# Patient Record
Sex: Female | Born: 1940 | Race: White | Hispanic: No | Marital: Married | Smoking: Never smoker
Health system: Southern US, Community
[De-identification: ages and names within clinical notes are randomized; demographics above are authoritative.]

## PROBLEM LIST (undated history)

## (undated) DIAGNOSIS — I10 Essential (primary) hypertension: Secondary | ICD-10-CM

## (undated) DIAGNOSIS — C4491 Basal cell carcinoma of skin, unspecified: Secondary | ICD-10-CM

## (undated) DIAGNOSIS — M1990 Unspecified osteoarthritis, unspecified site: Secondary | ICD-10-CM

## (undated) HISTORY — PX: BACK SURGERY: SHX140

## (undated) HISTORY — PX: OTHER SURGICAL HISTORY: SHX169

## (undated) HISTORY — PX: TUBAL LIGATION: SHX77

## (undated) HISTORY — DX: Basal cell carcinoma of skin, unspecified: C44.91

## (undated) HISTORY — PX: HAND SURGERY: SHX662

---

## 1999-07-01 ENCOUNTER — Ambulatory Visit (HOSPITAL_COMMUNITY): Admission: RE | Admit: 1999-07-01 | Discharge: 1999-07-01 | Payer: Self-pay | Admitting: Obstetrics and Gynecology

## 1999-10-30 ENCOUNTER — Encounter: Admission: RE | Admit: 1999-10-30 | Discharge: 1999-10-30 | Payer: Self-pay | Admitting: Obstetrics and Gynecology

## 1999-10-30 ENCOUNTER — Encounter: Payer: Self-pay | Admitting: Obstetrics and Gynecology

## 2000-07-28 ENCOUNTER — Other Ambulatory Visit: Admission: RE | Admit: 2000-07-28 | Discharge: 2000-07-28 | Payer: Self-pay | Admitting: Obstetrics and Gynecology

## 2000-11-01 ENCOUNTER — Encounter: Payer: Self-pay | Admitting: Obstetrics and Gynecology

## 2000-11-01 ENCOUNTER — Encounter: Admission: RE | Admit: 2000-11-01 | Discharge: 2000-11-01 | Payer: Self-pay | Admitting: Obstetrics and Gynecology

## 2001-08-10 ENCOUNTER — Other Ambulatory Visit: Admission: RE | Admit: 2001-08-10 | Discharge: 2001-08-10 | Payer: Self-pay | Admitting: Internal Medicine

## 2001-11-07 ENCOUNTER — Encounter: Payer: Self-pay | Admitting: Obstetrics and Gynecology

## 2001-11-07 ENCOUNTER — Encounter: Admission: RE | Admit: 2001-11-07 | Discharge: 2001-11-07 | Payer: Self-pay | Admitting: Obstetrics and Gynecology

## 2002-11-10 ENCOUNTER — Encounter: Admission: RE | Admit: 2002-11-10 | Discharge: 2002-11-10 | Payer: Self-pay | Admitting: Obstetrics and Gynecology

## 2002-11-10 ENCOUNTER — Encounter: Payer: Self-pay | Admitting: Obstetrics and Gynecology

## 2003-11-12 ENCOUNTER — Encounter: Admission: RE | Admit: 2003-11-12 | Discharge: 2003-11-12 | Payer: Self-pay | Admitting: Obstetrics and Gynecology

## 2004-11-17 ENCOUNTER — Encounter: Admission: RE | Admit: 2004-11-17 | Discharge: 2004-11-17 | Payer: Self-pay | Admitting: Obstetrics and Gynecology

## 2005-11-18 ENCOUNTER — Encounter: Admission: RE | Admit: 2005-11-18 | Discharge: 2005-11-18 | Payer: Self-pay | Admitting: Obstetrics and Gynecology

## 2006-11-19 ENCOUNTER — Encounter: Admission: RE | Admit: 2006-11-19 | Discharge: 2006-11-19 | Payer: Self-pay | Admitting: Obstetrics and Gynecology

## 2007-11-22 ENCOUNTER — Encounter: Admission: RE | Admit: 2007-11-22 | Discharge: 2007-11-22 | Payer: Self-pay | Admitting: Obstetrics and Gynecology

## 2008-11-23 ENCOUNTER — Encounter: Admission: RE | Admit: 2008-11-23 | Discharge: 2008-11-23 | Payer: Self-pay | Admitting: Obstetrics and Gynecology

## 2009-01-08 ENCOUNTER — Encounter: Admission: RE | Admit: 2009-01-08 | Discharge: 2009-01-08 | Payer: Self-pay | Admitting: Family Medicine

## 2009-11-25 ENCOUNTER — Encounter: Admission: RE | Admit: 2009-11-25 | Discharge: 2009-11-25 | Payer: Self-pay | Admitting: Obstetrics and Gynecology

## 2010-11-05 ENCOUNTER — Other Ambulatory Visit: Payer: Self-pay | Admitting: Obstetrics and Gynecology

## 2010-11-05 DIAGNOSIS — Z1239 Encounter for other screening for malignant neoplasm of breast: Secondary | ICD-10-CM

## 2010-11-26 ENCOUNTER — Ambulatory Visit
Admission: RE | Admit: 2010-11-26 | Discharge: 2010-11-26 | Disposition: A | Discharge status: Home / Self Care | Payer: BC Managed Care – PPO | Source: Ambulatory Visit | Attending: Obstetrics and Gynecology | Admitting: Obstetrics and Gynecology

## 2010-11-26 DIAGNOSIS — Z1239 Encounter for other screening for malignant neoplasm of breast: Secondary | ICD-10-CM

## 2011-10-28 ENCOUNTER — Other Ambulatory Visit: Payer: Self-pay | Admitting: Obstetrics and Gynecology

## 2011-10-28 DIAGNOSIS — Z1231 Encounter for screening mammogram for malignant neoplasm of breast: Secondary | ICD-10-CM

## 2011-12-02 ENCOUNTER — Ambulatory Visit
Admission: RE | Admit: 2011-12-02 | Discharge: 2011-12-02 | Disposition: A | Discharge status: Home / Self Care | Payer: BC Managed Care – PPO | Source: Ambulatory Visit | Attending: Obstetrics and Gynecology | Admitting: Obstetrics and Gynecology

## 2011-12-02 DIAGNOSIS — Z1231 Encounter for screening mammogram for malignant neoplasm of breast: Secondary | ICD-10-CM

## 2012-11-01 ENCOUNTER — Other Ambulatory Visit: Payer: Self-pay | Admitting: Obstetrics and Gynecology

## 2012-11-01 DIAGNOSIS — Z1231 Encounter for screening mammogram for malignant neoplasm of breast: Secondary | ICD-10-CM

## 2012-12-02 ENCOUNTER — Ambulatory Visit
Admission: RE | Admit: 2012-12-02 | Discharge: 2012-12-02 | Disposition: A | Discharge status: Home / Self Care | Payer: Medicare Other | Source: Ambulatory Visit | Attending: Obstetrics and Gynecology | Admitting: Obstetrics and Gynecology

## 2012-12-02 DIAGNOSIS — Z1231 Encounter for screening mammogram for malignant neoplasm of breast: Secondary | ICD-10-CM

## 2012-12-02 IMAGING — MG MM DIGITAL SCREENING BILAT W/ CAD
5 series · 5 of 5 positions shown · non-contrast
Comparison: Previous exams.

CLINICAL DATA: Screening.

DIGITAL BILATERAL SCREENING MAMMOGRAM WITH CAD

[R MLO]
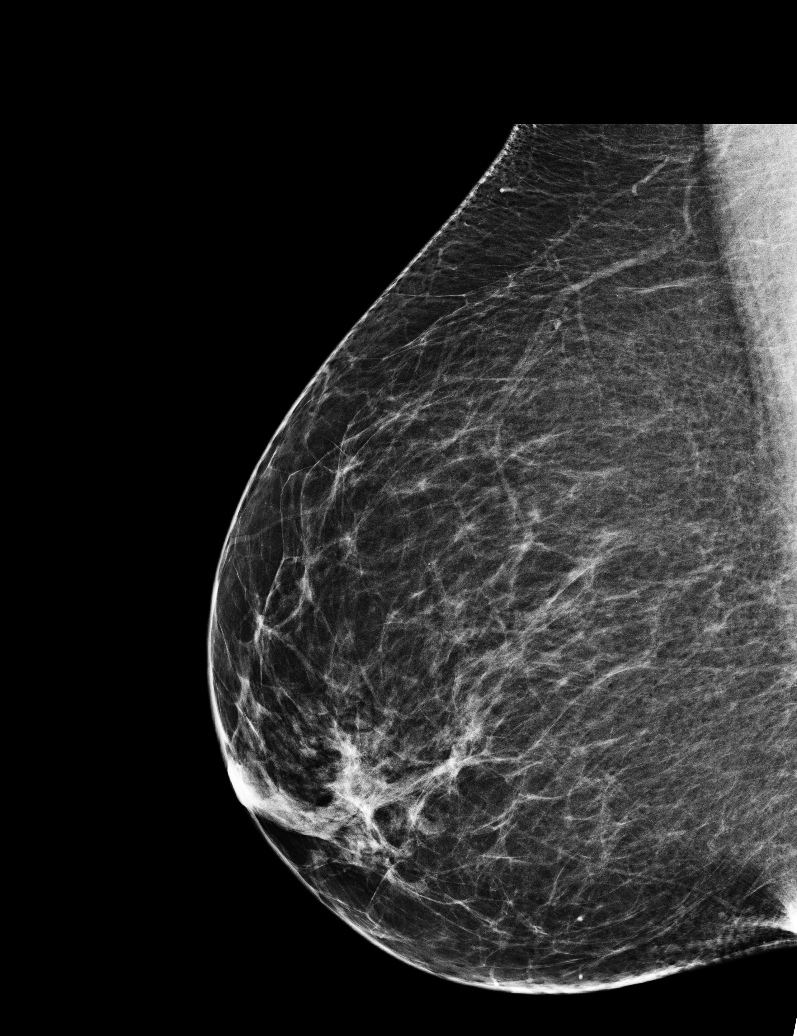

[L MLO (1 of 2)]
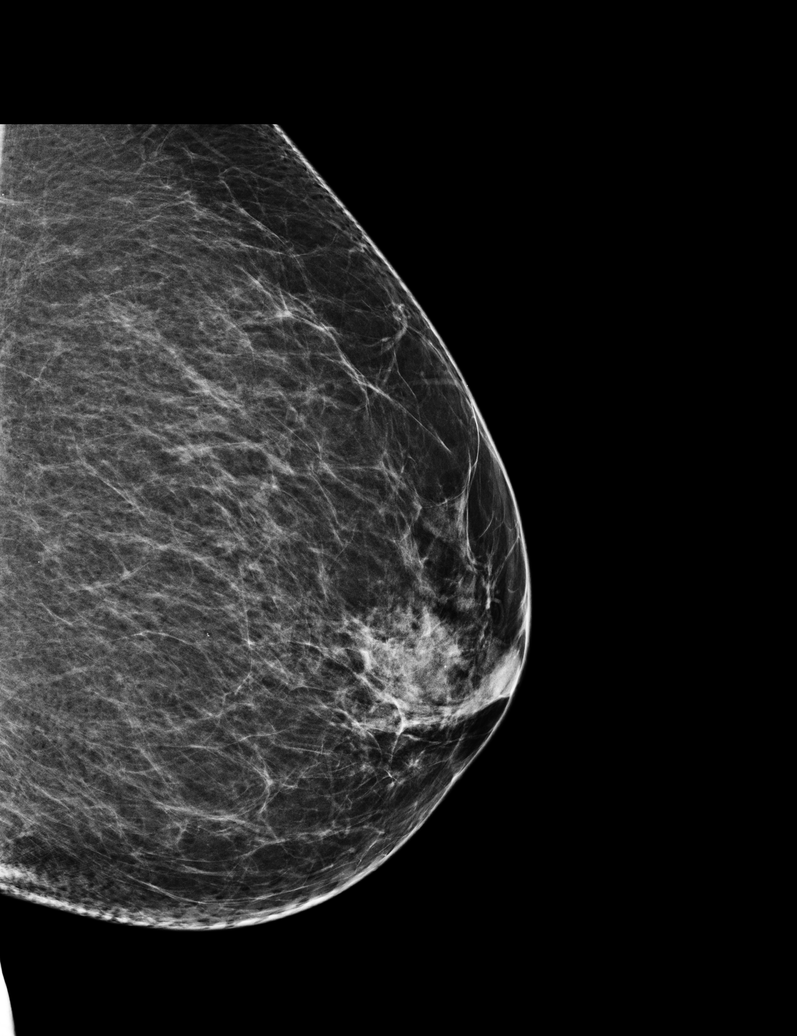

[L CC]
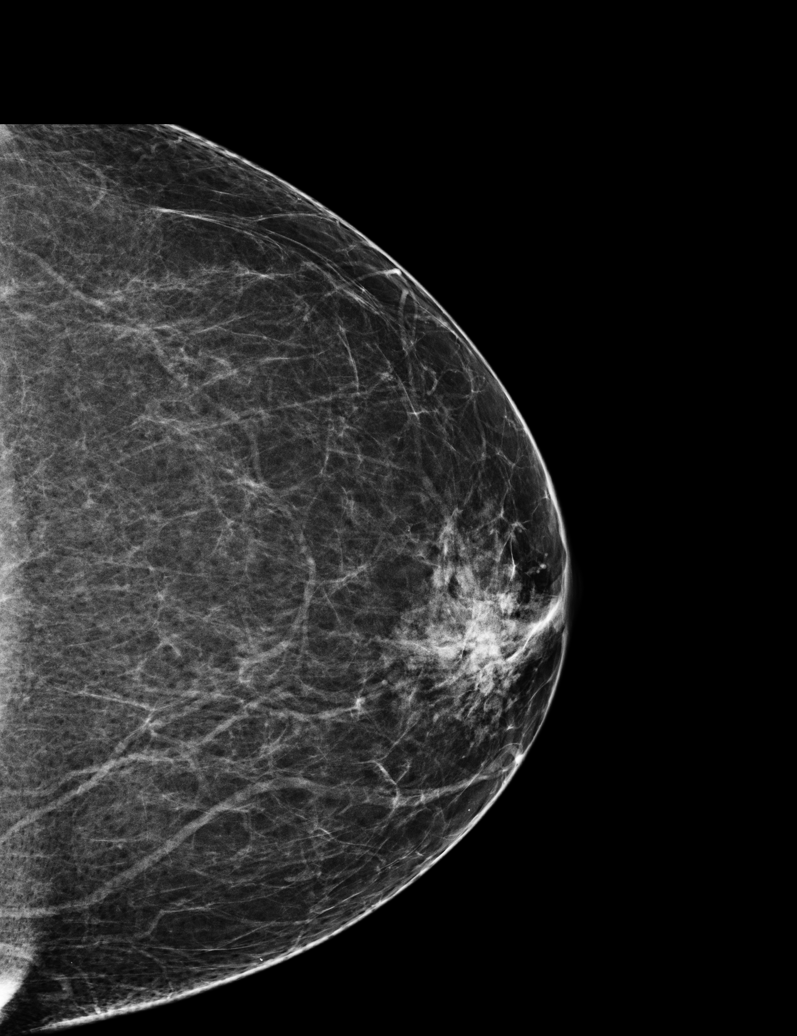

[L MLO (2 of 2)]
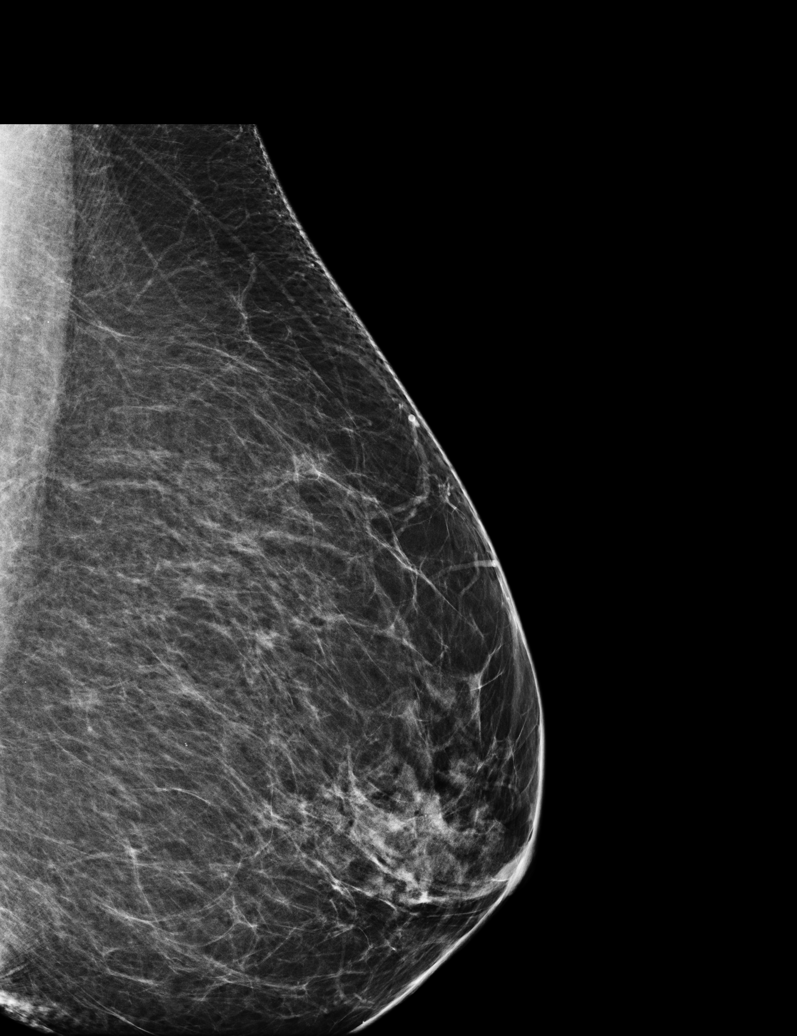

[R CC]
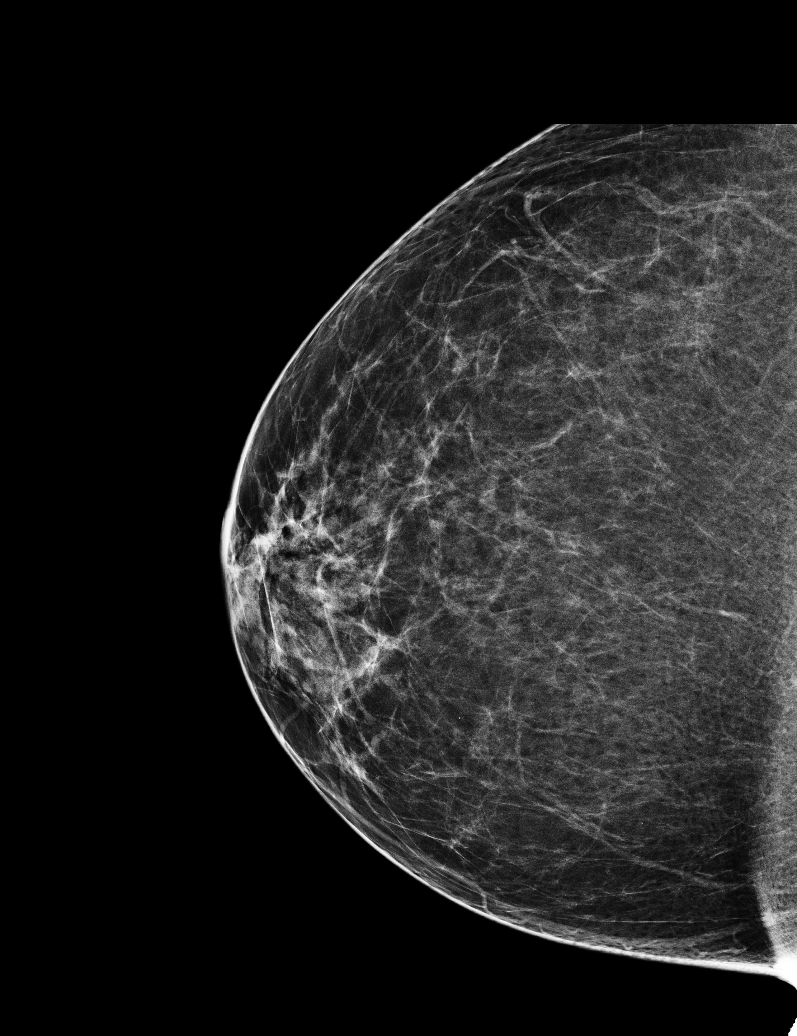

[5 of 5 positions shown; findings below may reference images not displayed]

FINDINGS: ACR Breast Density Category 2: There is a scattered fibroglandular
pattern.

No suspicious masses, architectural distortion, or calcifications
are present.

Images were processed with CAD.
IMPRESSION: No mammographic evidence of malignancy.

A result letter of this screening mammogram will be mailed directly
to the patient.

RECOMMENDATION:
Screening mammogram in one year. (Code:[FJ])

BI-RADS CATEGORY 2:  Benign finding(s).

## 2015-08-05 ENCOUNTER — Other Ambulatory Visit: Payer: Self-pay | Admitting: Neurological Surgery

## 2015-09-09 NOTE — Pre-Procedure Instructions (Signed)
Kristina Davis  09/09/2015      [place], [place] - [address] [place] [address] Phone: [phone] Fax: [phone]    Your procedure is scheduled on Mon, Dec 5 @ 7:30 AM  Report to [facility] Admitting at 5:30 AM  Call this number if you have problems the morning of surgery:  [phone]   Remember:  Do not eat food or drink liquids after midnight.  Take these medicines the morning of surgery with A SIP OF WATER Zyrtec(Cetirizine)              Stop taking your Aspirin along with any Vitamins. No Goody's,BC's,Aleve,Ibuprofen,Motrin,Advil,Fish Oil,or any Herbal Medications.    Do not wear jewelry, make-up or nail polish.  Do not wear lotions, powders, or perfumes.  You may wear deodorant.  Do not shave 48 hours prior to surgery.    Do not bring valuables to the hospital.  [facility] is not responsible for any belongings or valuables.  Contacts, dentures or bridgework may not be worn into surgery.  Leave your suitcase in the car.  After surgery it may be brought to your room.  For patients admitted to the hospital, discharge time will be determined by your treatment team.  Patients discharged the day of surgery will not be allowed to drive home.    Special instructions:  [facility] - Preparing for Surgery  Before surgery, you can play an important role.  Because skin is not sterile, your skin needs to be as free of germs as possible.  You can reduce the number of germs on you skin by washing with CHG (chlorahexidine gluconate) soap before surgery.  CHG is an antiseptic cleaner which kills germs and bonds with the skin to continue killing germs even after washing.  Please DO NOT use if you have an allergy to CHG or antibacterial soaps.  If your skin becomes reddened/irritated stop using the CHG and inform your nurse when you arrive at Short Stay.  Do not shave (including legs and underarms)  for at least 48 hours prior to the first CHG shower.  You may shave your face.  Please follow these instructions carefully:   1.  Shower with CHG Soap the night before surgery and the                                morning of Surgery.  2.  If you choose to wash your hair, wash your hair first as usual with your       normal shampoo.  3.  After you shampoo, rinse your hair and body thoroughly to remove the                      Shampoo.  4.  Use CHG as you would any other liquid soap.  You can apply chg directly       to the skin and wash gently with scrungie or a clean washcloth.  5.  Apply the CHG Soap to your body ONLY FROM THE NECK DOWN.        Do not use on open wounds or open sores.  Avoid contact with your eyes,       ears, mouth and genitals (private parts).  Wash genitals (private parts)       with your normal soap.  6.  Wash thoroughly, paying special attention to the area where your surgery        will be performed.  7.  Thoroughly rinse your body with warm water from the neck down.  8.  DO NOT shower/wash with your normal soap after using and rinsing off       the CHG Soap.  9.  Pat yourself dry with a clean towel.            10.  Wear clean pajamas.            11.  Place clean sheets on your bed the night of your first shower and do not        sleep with pets.  Day of Surgery  Do not apply any lotions/deoderants the morning of surgery.  Please wear clean clothes to the hospital/surgery center.    Please read over the following fact sheets that you were given. Pain Booklet, Coughing and Deep Breathing, Blood Transfusion Information, MRSA Information and Surgical Site Infection Prevention

## 2015-09-10 ENCOUNTER — Encounter (HOSPITAL_COMMUNITY): Payer: Self-pay

## 2015-09-10 ENCOUNTER — Encounter (HOSPITAL_COMMUNITY)
Admission: RE | Admit: 2015-09-10 | Discharge: 2015-09-10 | Disposition: A | Discharge status: Home / Self Care | Payer: Medicare PPO | Source: Ambulatory Visit | Attending: Neurological Surgery | Admitting: Neurological Surgery

## 2015-09-10 DIAGNOSIS — Z0183 Encounter for blood typing: Secondary | ICD-10-CM | POA: Insufficient documentation

## 2015-09-10 DIAGNOSIS — R9431 Abnormal electrocardiogram [ECG] [EKG]: Secondary | ICD-10-CM | POA: Insufficient documentation

## 2015-09-10 DIAGNOSIS — M4316 Spondylolisthesis, lumbar region: Secondary | ICD-10-CM | POA: Insufficient documentation

## 2015-09-10 DIAGNOSIS — Z01818 Encounter for other preprocedural examination: Secondary | ICD-10-CM | POA: Diagnosis not present

## 2015-09-10 DIAGNOSIS — Z01812 Encounter for preprocedural laboratory examination: Secondary | ICD-10-CM | POA: Insufficient documentation

## 2015-09-10 HISTORY — DX: Unspecified osteoarthritis, unspecified site: M19.90

## 2015-09-10 HISTORY — DX: Essential (primary) hypertension: I10

## 2015-09-10 LAB — BASIC METABOLIC PANEL
Anion gap: 7 (ref 5–15)
BUN: 15 mg/dL (ref 6–20)
CALCIUM: 10.2 mg/dL (ref 8.9–10.3)
CO2: 29 mmol/L (ref 22–32)
CREATININE: 0.86 mg/dL (ref 0.44–1.00)
Chloride: 104 mmol/L (ref 101–111)
GFR calc Af Amer: >60 mL/min (ref >60)
GLUCOSE: 95 mg/dL (ref 65–99)
POTASSIUM: 4.1 mmol/L (ref 3.5–5.1)
SODIUM: 140 mmol/L (ref 135–145)

## 2015-09-10 LAB — SURGICAL PCR SCREEN
MRSA, PCR: NEGATIVE
Staphylococcus aureus: NEGATIVE

## 2015-09-10 LAB — CBC
HEMATOCRIT: 45.9 % (ref 36.0–46.0)
Hemoglobin: 15.4 g/dL — ABNORMAL HIGH (ref 12.0–15.0)
MCH: 30.3 pg (ref 26.0–34.0)
MCHC: 33.6 g/dL (ref 30.0–36.0)
MCV: 90.4 fL (ref 78.0–100.0)
PLATELETS: 236 10*3/uL (ref 150–400)
RBC: 5.08 MIL/uL (ref 3.87–5.11)
RDW: 13.8 % (ref 11.5–15.5)
WBC: 8 10*3/uL (ref 4.0–10.5)

## 2015-09-10 LAB — TYPE AND SCREEN
ABO/RH(D): B POS
ANTIBODY SCREEN: NEGATIVE

## 2015-09-10 LAB — ABO/RH: ABO/RH(D): B POS

## 2015-09-15 MED ORDER — CEFAZOLIN SODIUM-DEXTROSE 2-3 GM-% IV SOLR
2.0000 g | INTRAVENOUS | Status: AC
  Administered 2015-09-16: 2 g via INTRAVENOUS
  Filled 2015-09-15: qty 50

## 2015-09-16 ENCOUNTER — Encounter (HOSPITAL_COMMUNITY)
Admission: RE | Disposition: A | Discharge status: Home / Self Care | Payer: Medicare PPO | Source: Ambulatory Visit | Attending: Neurological Surgery

## 2015-09-16 ENCOUNTER — Inpatient Hospital Stay (HOSPITAL_COMMUNITY): Payer: Medicare PPO

## 2015-09-16 ENCOUNTER — Inpatient Hospital Stay (HOSPITAL_COMMUNITY): Payer: Medicare PPO | Admitting: Anesthesiology

## 2015-09-16 ENCOUNTER — Encounter (HOSPITAL_COMMUNITY): Payer: Self-pay | Admitting: General Practice

## 2015-09-16 ENCOUNTER — Inpatient Hospital Stay (HOSPITAL_COMMUNITY)
Admission: RE | Admit: 2015-09-16 | Discharge: 2015-09-18 | DRG: 460 | Disposition: A | Discharge status: Home / Self Care | Payer: Medicare PPO | Source: Ambulatory Visit | Attending: Neurological Surgery | Admitting: Neurological Surgery

## 2015-09-16 DIAGNOSIS — I10 Essential (primary) hypertension: Secondary | ICD-10-CM | POA: Diagnosis present

## 2015-09-16 DIAGNOSIS — Z7982 Long term (current) use of aspirin: Secondary | ICD-10-CM

## 2015-09-16 DIAGNOSIS — Z880 Allergy status to penicillin: Secondary | ICD-10-CM

## 2015-09-16 DIAGNOSIS — M4316 Spondylolisthesis, lumbar region: Secondary | ICD-10-CM | POA: Diagnosis present

## 2015-09-16 DIAGNOSIS — Z881 Allergy status to other antibiotic agents status: Secondary | ICD-10-CM

## 2015-09-16 DIAGNOSIS — Z79899 Other long term (current) drug therapy: Secondary | ICD-10-CM | POA: Diagnosis not present

## 2015-09-16 DIAGNOSIS — Z419 Encounter for procedure for purposes other than remedying health state, unspecified: Secondary | ICD-10-CM

## 2015-09-16 DIAGNOSIS — Z888 Allergy status to other drugs, medicaments and biological substances status: Secondary | ICD-10-CM

## 2015-09-16 SURGERY — POSTERIOR LUMBAR FUSION 1 LEVEL
Anesthesia: General | Site: Back

## 2015-09-16 MED ORDER — BUPIVACAINE HCL (PF) 0.5 % IJ SOLN
INTRAMUSCULAR | Status: DC | PRN
  Administered 2015-09-16: 10 mL

## 2015-09-16 MED ORDER — SODIUM CHLORIDE 0.9 % IJ SOLN: 3.0000 mL | INTRAMUSCULAR | Status: DC | PRN

## 2015-09-16 MED ORDER — GLYCOPYRROLATE 0.2 MG/ML IJ SOLN
INTRAMUSCULAR | Status: DC | PRN
  Administered 2015-09-16: 0.6 mg via INTRAVENOUS

## 2015-09-16 MED ORDER — FENTANYL CITRATE (PF) 100 MCG/2ML IJ SOLN
INTRAMUSCULAR | Status: DC | PRN
  Administered 2015-09-16 (×2): 25 ug via INTRAVENOUS
  Administered 2015-09-16 (×4): 50 ug via INTRAVENOUS

## 2015-09-16 MED ORDER — LACTATED RINGERS IV SOLN
INTRAVENOUS | Status: DC | PRN
  Administered 2015-09-16 (×2): via INTRAVENOUS

## 2015-09-16 MED ORDER — HYDROMORPHONE HCL 1 MG/ML IJ SOLN: 0.5000 mg | INTRAMUSCULAR | Status: DC | PRN

## 2015-09-16 MED ORDER — PHENOL 1.4 % MT LIQD: 1.0000 | OROMUCOSAL | Status: DC | PRN

## 2015-09-16 MED ORDER — THROMBIN 5000 UNITS EX SOLR
CUTANEOUS | Status: DC | PRN
  Administered 2015-09-16: 07:00:00 via TOPICAL

## 2015-09-16 MED ORDER — PROPOFOL 10 MG/ML IV BOLUS
INTRAVENOUS | Status: DC | PRN
  Administered 2015-09-16: 120 mg via INTRAVENOUS

## 2015-09-16 MED ORDER — HYDROMORPHONE HCL 1 MG/ML IJ SOLN
0.2500 mg | INTRAMUSCULAR | Status: DC | PRN
  Administered 2015-09-16 (×2): 0.25 mg via INTRAVENOUS
  Administered 2015-09-16: 0.5 mg via INTRAVENOUS

## 2015-09-16 MED ORDER — HYDROCODONE-ACETAMINOPHEN 5-325 MG PO TABS: 1.0000 | ORAL_TABLET | ORAL | Status: DC | PRN

## 2015-09-16 MED ORDER — POLYETHYLENE GLYCOL 3350 17 G PO PACK: 17.0000 g | PACK | Freq: Every day | ORAL | Status: DC | PRN

## 2015-09-16 MED ORDER — 0.9 % SODIUM CHLORIDE (POUR BTL) OPTIME
TOPICAL | Status: DC | PRN
  Administered 2015-09-16: 1000 mL

## 2015-09-16 MED ORDER — ALUM & MAG HYDROXIDE-SIMETH 200-200-20 MG/5ML PO SUSP: 30.0000 mL | Freq: Four times a day (QID) | ORAL | Status: DC | PRN

## 2015-09-16 MED ORDER — NEOSTIGMINE METHYLSULFATE 10 MG/10ML IV SOLN
INTRAVENOUS | Status: AC
  Filled 2015-09-16: qty 1

## 2015-09-16 MED ORDER — EPHEDRINE SULFATE 50 MG/ML IJ SOLN
INTRAMUSCULAR | Status: DC | PRN
  Administered 2015-09-16: 5 mg via INTRAVENOUS

## 2015-09-16 MED ORDER — SODIUM CHLORIDE 0.9 % IV SOLN: INTRAVENOUS | Status: DC

## 2015-09-16 MED ORDER — DOCUSATE SODIUM 100 MG PO CAPS
100.0000 mg | ORAL_CAPSULE | Freq: Two times a day (BID) | ORAL | Status: DC
  Administered 2015-09-16 – 2015-09-18 (×4): 100 mg via ORAL
  Filled 2015-09-16 (×4): qty 1

## 2015-09-16 MED ORDER — STERILE WATER FOR INJECTION IJ SOLN
INTRAMUSCULAR | Status: AC
  Filled 2015-09-16: qty 10

## 2015-09-16 MED ORDER — NEOSTIGMINE METHYLSULFATE 10 MG/10ML IV SOLN
INTRAVENOUS | Status: DC | PRN
  Administered 2015-09-16: 4 mg via INTRAVENOUS

## 2015-09-16 MED ORDER — LIDOCAINE-EPINEPHRINE 1 %-1:100000 IJ SOLN
INTRAMUSCULAR | Status: DC | PRN
  Administered 2015-09-16: 10 mL

## 2015-09-16 MED ORDER — SODIUM CHLORIDE 0.9 % IR SOLN
Status: DC | PRN
  Administered 2015-09-16: 07:00:00

## 2015-09-16 MED ORDER — ACETAMINOPHEN 650 MG RE SUPP: 650.0000 mg | RECTAL | Status: DC | PRN

## 2015-09-16 MED ORDER — ARTIFICIAL TEARS OP OINT
TOPICAL_OINTMENT | OPHTHALMIC | Status: AC
  Filled 2015-09-16: qty 3.5

## 2015-09-16 MED ORDER — FENTANYL CITRATE (PF) 250 MCG/5ML IJ SOLN
INTRAMUSCULAR | Status: AC
  Filled 2015-09-16: qty 5

## 2015-09-16 MED ORDER — CEFAZOLIN SODIUM 1-5 GM-% IV SOLN
1.0000 g | Freq: Three times a day (TID) | INTRAVENOUS | Status: AC
  Administered 2015-09-16 (×2): 1 g via INTRAVENOUS
  Filled 2015-09-16 (×3): qty 50

## 2015-09-16 MED ORDER — LORATADINE 10 MG PO TABS
10.0000 mg | ORAL_TABLET | Freq: Every day | ORAL | Status: DC
  Administered 2015-09-17: 10 mg via ORAL
  Filled 2015-09-16: qty 1

## 2015-09-16 MED ORDER — HYDROCHLOROTHIAZIDE 25 MG PO TABS
25.0000 mg | ORAL_TABLET | Freq: Every day | ORAL | Status: DC
Start: 2015-09-16 — End: 2015-09-18
  Administered 2015-09-16 – 2015-09-17 (×2): 25 mg via ORAL
  Filled 2015-09-16 (×2): qty 1

## 2015-09-16 MED ORDER — MIDAZOLAM HCL 2 MG/2ML IJ SOLN
INTRAMUSCULAR | Status: AC
  Filled 2015-09-16: qty 2

## 2015-09-16 MED ORDER — ONDANSETRON HCL 4 MG/2ML IJ SOLN
INTRAMUSCULAR | Status: DC | PRN
  Administered 2015-09-16: 4 mg via INTRAVENOUS

## 2015-09-16 MED ORDER — DEXAMETHASONE SODIUM PHOSPHATE 10 MG/ML IJ SOLN
INTRAMUSCULAR | Status: AC
  Filled 2015-09-16: qty 1

## 2015-09-16 MED ORDER — BISACODYL 10 MG RE SUPP: 10.0000 mg | Freq: Every day | RECTAL | Status: DC | PRN

## 2015-09-16 MED ORDER — ARTIFICIAL TEARS OP OINT
TOPICAL_OINTMENT | OPHTHALMIC | Status: DC | PRN
  Administered 2015-09-16: 1 via OPHTHALMIC

## 2015-09-16 MED ORDER — FLEET ENEMA 7-19 GM/118ML RE ENEM: 1.0000 | ENEMA | Freq: Once | RECTAL | Status: DC | PRN

## 2015-09-16 MED ORDER — HYDROMORPHONE HCL 1 MG/ML IJ SOLN
INTRAMUSCULAR | Status: AC
  Filled 2015-09-16: qty 1

## 2015-09-16 MED ORDER — ONDANSETRON HCL 4 MG/2ML IJ SOLN: 4.0000 mg | Freq: Once | INTRAMUSCULAR | Status: DC | PRN

## 2015-09-16 MED ORDER — LIDOCAINE HCL (CARDIAC) 20 MG/ML IV SOLN
INTRAVENOUS | Status: DC | PRN
  Administered 2015-09-16: 60 mg via INTRAVENOUS

## 2015-09-16 MED ORDER — LIDOCAINE HCL (CARDIAC) 20 MG/ML IV SOLN
INTRAVENOUS | Status: AC
  Filled 2015-09-16: qty 5

## 2015-09-16 MED ORDER — KETOROLAC TROMETHAMINE 15 MG/ML IJ SOLN
INTRAMUSCULAR | Status: AC
  Filled 2015-09-16: qty 1

## 2015-09-16 MED ORDER — ROCURONIUM BROMIDE 100 MG/10ML IV SOLN
INTRAVENOUS | Status: DC | PRN
  Administered 2015-09-16: 30 mg via INTRAVENOUS
  Administered 2015-09-16: 20 mg via INTRAVENOUS
  Administered 2015-09-16: 50 mg via INTRAVENOUS

## 2015-09-16 MED ORDER — METHOCARBAMOL 1000 MG/10ML IJ SOLN
500.0000 mg | Freq: Four times a day (QID) | INTRAVENOUS | Status: DC | PRN
  Filled 2015-09-16: qty 5

## 2015-09-16 MED ORDER — ROCURONIUM BROMIDE 50 MG/5ML IV SOLN
INTRAVENOUS | Status: AC
  Filled 2015-09-16: qty 1

## 2015-09-16 MED ORDER — THROMBIN 20000 UNITS EX SOLR
CUTANEOUS | Status: DC | PRN
  Administered 2015-09-16: 08:00:00 via TOPICAL

## 2015-09-16 MED ORDER — MIDAZOLAM HCL 5 MG/5ML IJ SOLN
INTRAMUSCULAR | Status: DC | PRN
  Administered 2015-09-16 (×2): 1 mg via INTRAVENOUS

## 2015-09-16 MED ORDER — DEXAMETHASONE SODIUM PHOSPHATE 10 MG/ML IJ SOLN
INTRAMUSCULAR | Status: DC | PRN
  Administered 2015-09-16: 10 mg via INTRAVENOUS

## 2015-09-16 MED ORDER — HYDROCODONE-ACETAMINOPHEN 7.5-325 MG PO TABS: 1.0000 | ORAL_TABLET | Freq: Once | ORAL | Status: DC | PRN

## 2015-09-16 MED ORDER — MENTHOL 3 MG MT LOZG: 1.0000 | LOZENGE | OROMUCOSAL | Status: DC | PRN

## 2015-09-16 MED ORDER — ONDANSETRON HCL 4 MG/2ML IJ SOLN
INTRAMUSCULAR | Status: AC
  Filled 2015-09-16: qty 2

## 2015-09-16 MED ORDER — OXYCODONE-ACETAMINOPHEN 5-325 MG PO TABS
1.0000 | ORAL_TABLET | ORAL | Status: DC | PRN
  Administered 2015-09-16 – 2015-09-18 (×7): 2 via ORAL
  Filled 2015-09-16 (×7): qty 2

## 2015-09-16 MED ORDER — ONDANSETRON HCL 4 MG/2ML IJ SOLN
4.0000 mg | INTRAMUSCULAR | Status: DC | PRN
  Administered 2015-09-16: 4 mg via INTRAVENOUS
  Filled 2015-09-16: qty 2

## 2015-09-16 MED ORDER — GLYCOPYRROLATE 0.2 MG/ML IJ SOLN
INTRAMUSCULAR | Status: AC
  Filled 2015-09-16: qty 3

## 2015-09-16 MED ORDER — SODIUM CHLORIDE 0.9 % IJ SOLN
3.0000 mL | Freq: Two times a day (BID) | INTRAMUSCULAR | Status: DC
  Administered 2015-09-16 – 2015-09-17 (×4): 3 mL via INTRAVENOUS

## 2015-09-16 MED ORDER — ACETAMINOPHEN 325 MG PO TABS: 650.0000 mg | ORAL_TABLET | ORAL | Status: DC | PRN

## 2015-09-16 MED ORDER — SENNA 8.6 MG PO TABS
1.0000 | ORAL_TABLET | Freq: Two times a day (BID) | ORAL | Status: DC
  Administered 2015-09-16 – 2015-09-17 (×3): 8.6 mg via ORAL
  Filled 2015-09-16 (×3): qty 1

## 2015-09-16 MED ORDER — KETOROLAC TROMETHAMINE 15 MG/ML IJ SOLN
15.0000 mg | Freq: Four times a day (QID) | INTRAMUSCULAR | Status: AC
  Administered 2015-09-16 – 2015-09-17 (×4): 15 mg via INTRAVENOUS
  Filled 2015-09-16 (×4): qty 1

## 2015-09-16 MED ORDER — EPHEDRINE SULFATE 50 MG/ML IJ SOLN
INTRAMUSCULAR | Status: AC
  Filled 2015-09-16: qty 1

## 2015-09-16 MED ORDER — METHOCARBAMOL 500 MG PO TABS
500.0000 mg | ORAL_TABLET | Freq: Four times a day (QID) | ORAL | Status: DC | PRN
  Administered 2015-09-16 – 2015-09-18 (×4): 500 mg via ORAL
  Filled 2015-09-16 (×4): qty 1

## 2015-09-16 MED ORDER — PROPOFOL 10 MG/ML IV BOLUS
INTRAVENOUS | Status: AC
  Filled 2015-09-16: qty 20

## 2015-09-16 MED FILL — Heparin Sodium (Porcine) Inj 1000 Unit/ML: INTRAMUSCULAR | Qty: 30 | Status: AC

## 2015-09-16 MED FILL — Sodium Chloride IV Soln 0.9%: INTRAVENOUS | Qty: 1000 | Status: AC

## 2015-09-16 SURGICAL SUPPLY — 66 items
BAG DECANTER FOR FLEXI CONT (MISCELLANEOUS) ×3 IMPLANT
BLADE CLIPPER SURG (BLADE) IMPLANT
BONE CANC CHIPS 20CC PCAN1/4 (Bone Implant) ×3 IMPLANT
BUR MATCHSTICK NEURO 3.0 LAGG (BURR) ×3 IMPLANT
CAGE MAS PLIF 9X9X23-8 LUMBAR (Cage) ×6 IMPLANT
CANISTER SUCT 3000ML PPV (MISCELLANEOUS) ×3 IMPLANT
CHIPS CANC BONE 20CC PCAN1/4 (Bone Implant) ×1 IMPLANT
CONT SPEC 4OZ CLIKSEAL STRL BL (MISCELLANEOUS) ×3 IMPLANT
COVER BACK TABLE 60X90IN (DRAPES) ×3 IMPLANT
DECANTER SPIKE VIAL GLASS SM (MISCELLANEOUS) ×3 IMPLANT
DERMABOND ADVANCED (GAUZE/BANDAGES/DRESSINGS) ×2
DERMABOND ADVANCED .7 DNX12 (GAUZE/BANDAGES/DRESSINGS) ×1 IMPLANT
DRAPE C-ARM 42X72 X-RAY (DRAPES) ×6 IMPLANT
DRAPE LAPAROTOMY 100X72X124 (DRAPES) ×3 IMPLANT
DRAPE POUCH INSTRU U-SHP 10X18 (DRAPES) ×3 IMPLANT
DRAPE PROXIMA HALF (DRAPES) IMPLANT
DURAPREP 26ML APPLICATOR (WOUND CARE) ×3 IMPLANT
ELECT REM PT RETURN 9FT ADLT (ELECTROSURGICAL) ×3
ELECTRODE REM PT RTRN 9FT ADLT (ELECTROSURGICAL) ×1 IMPLANT
GAUZE SPONGE 4X4 12PLY STRL (GAUZE/BANDAGES/DRESSINGS) ×3 IMPLANT
GAUZE SPONGE 4X4 16PLY XRAY LF (GAUZE/BANDAGES/DRESSINGS) IMPLANT
GLOVE BIO SURGEON STRL SZ7 (GLOVE) ×6 IMPLANT
GLOVE BIO SURGEON STRL SZ8 (GLOVE) ×3 IMPLANT
GLOVE BIOGEL PI IND STRL 6.5 (GLOVE) ×2 IMPLANT
GLOVE BIOGEL PI IND STRL 8.5 (GLOVE) ×2 IMPLANT
GLOVE BIOGEL PI INDICATOR 6.5 (GLOVE) ×4
GLOVE BIOGEL PI INDICATOR 8.5 (GLOVE) ×4
GLOVE ECLIPSE 8.5 STRL (GLOVE) ×6 IMPLANT
GLOVE EXAM NITRILE LRG STRL (GLOVE) IMPLANT
GLOVE EXAM NITRILE MD LF STRL (GLOVE) IMPLANT
GLOVE EXAM NITRILE XL STR (GLOVE) IMPLANT
GLOVE EXAM NITRILE XS STR PU (GLOVE) IMPLANT
GLOVE INDICATOR 6.5 STRL GRN (GLOVE) ×3 IMPLANT
GLOVE INDICATOR 7.0 STRL GRN (GLOVE) ×9 IMPLANT
GLOVE INDICATOR 7.5 STRL GRN (GLOVE) ×3 IMPLANT
GOWN STRL REUS W/ TWL LRG LVL3 (GOWN DISPOSABLE) ×2 IMPLANT
GOWN STRL REUS W/ TWL XL LVL3 (GOWN DISPOSABLE) ×1 IMPLANT
GOWN STRL REUS W/TWL 2XL LVL3 (GOWN DISPOSABLE) ×6 IMPLANT
GOWN STRL REUS W/TWL LRG LVL3 (GOWN DISPOSABLE) ×4
GOWN STRL REUS W/TWL XL LVL3 (GOWN DISPOSABLE) ×2
HEMOSTAT POWDER KIT SURGIFOAM (HEMOSTASIS) ×3 IMPLANT
KIT BASIN OR (CUSTOM PROCEDURE TRAY) ×3 IMPLANT
KIT ROOM TURNOVER OR (KITS) ×3 IMPLANT
MILL MEDIUM DISP (BLADE) ×3 IMPLANT
MODULE POWER NUVASIVE (MISCELLANEOUS) ×1 IMPLANT
NEEDLE HYPO 22GX1.5 SAFETY (NEEDLE) ×3 IMPLANT
NS IRRIG 1000ML POUR BTL (IV SOLUTION) ×3 IMPLANT
PACK LAMINECTOMY NEURO (CUSTOM PROCEDURE TRAY) ×3 IMPLANT
PAD ARMBOARD 7.5X6 YLW CONV (MISCELLANEOUS) ×9 IMPLANT
PATTIES SURGICAL .5 X1 (DISPOSABLE) ×3 IMPLANT
POWER MODULE NUVASIVE (MISCELLANEOUS) ×3
ROD RELINE-O LORD 5.5X40 (Rod) ×6 IMPLANT
SCREW LOCK RELINE 5.5 TULIP (Screw) ×12 IMPLANT
SCREW RELINE-O POLY 6.5X45 (Screw) ×12 IMPLANT
SPONGE LAP 4X18 X RAY DECT (DISPOSABLE) IMPLANT
SPONGE SURGIFOAM ABS GEL 100 (HEMOSTASIS) ×3 IMPLANT
SUT VIC AB 1 CT1 18XBRD ANBCTR (SUTURE) ×1 IMPLANT
SUT VIC AB 1 CT1 8-18 (SUTURE) ×3
SUT VIC AB 2-0 CP2 18 (SUTURE) ×3 IMPLANT
SUT VIC AB 3-0 SH 8-18 (SUTURE) ×3 IMPLANT
SYR 3ML LL SCALE MARK (SYRINGE) ×12 IMPLANT
TOWEL OR 17X24 6PK STRL BLUE (TOWEL DISPOSABLE) ×3 IMPLANT
TOWEL OR 17X26 10 PK STRL BLUE (TOWEL DISPOSABLE) ×3 IMPLANT
TRAP SPECIMEN MUCOUS 40CC (MISCELLANEOUS) ×3 IMPLANT
TRAY FOLEY W/METER SILVER 14FR (SET/KITS/TRAYS/PACK) ×3 IMPLANT
WATER STERILE IRR 1000ML POUR (IV SOLUTION) ×3 IMPLANT

## 2015-09-16 NOTE — Op Note (Signed)
Date of surgery: 09/16/2015 Preoperative diagnosis: Spondylolisthesis L4-L5 with lumbar radiculopathy. Postoperative diagnosis: Spondylolisthesis L4-L5 with lumbar radiculopathy. Procedure: L4-L5 decompressive laminectomy decompression of L4 and L5 nerve roots, posterior lumbar interbody arthrodesis with peek spacers local autograft and allograft, pedicle screw fixation L4-L5, posterior lateral arthrodesis L4-L5  Surgeon: Kristeen Miss M.D.  Asst.: Francesca Jewett M.D.  Indications: Patient is Kristina Davis is a 74 y.o. female who who's had significant back pain and lumbar radiculopathy for over a years period time. A lumbar myelogram demonstrates advanced spondylolisthesis with high-grade canal stenosis. she was advised regarding surgical intervention.  Procedure: The patient was brought to the operating room supine on a stretcher. After the smooth induction of general endotracheal anesthesia she was turned prone and the back was prepped with alcohol and DuraPrep. The back was then draped sterilely. A midline incision was created and carried down to the lumbar dorsal fascia. A localizing radiograph identified the L4 and L5 spinous processes. A subligamentous dissection was created at L4 and L5 to expose the interlaminar space at L4 and L5 and the facet joints over the L4-L5 interspace. Laminotomies were were then created removing the entire inferior margin of the lamina of L4 including the inferior facet at the L4-L5 joint. The yellow ligament was taken up and the common dural tube was exposed along with the L4 nerve root superiorly, and the L5 nerve root inferiorly, the disc space was exposed and epidural veins in this region were cauterized and divided. The L4 nerve roots and the L5 nerve root were dissected with care taken to protect them. The disc space was opened and a combination of curettes and rongeurs was used to evacuate the disc space fully. The endplates were removed using sharp curettes. An  interbody spacer was placed to distract the disc space while the contralateral discectomy was performed. When the entirety of the disc was removed and the endplates were prepared final sizing of the disc space was obtained 9 mm peek spacers 12 lordosis 23 mm in length were chosen and packed with autograft and allograft and placed into the interspace. The remainder of the interspace was packed with autograft and allograft. Pedicle entry sites were then chosen using fluoroscopic guidance and 6.5 x 45 mm screws were placed in L4 and 6.5 x 45 mm screws were placed in L5. The lateral gutters were decorticated and graft was packed in the posterolateral gutters between L4 and L5. Final radiographs were obtained after placing appropriately sized rods between the pedicle screws at L4-L5 and torquing these to the appropriate tension. The surgical site was inspected carefully to assure the L4 and L5 nerve roots were well decompressed, hemostasis was obtained, and the graft was well packed. Then the retractors were removed and the wound was closed with #1 Vicryl in the lumbar dorsal fascia 2-0 Vicryl in the subcutaneous tissue and 3-0 Vicryl subcuticularly. When he cc of half percent Marcaine was injected into the paraspinous musculature at the time of closure. Blood loss was estimated at 150 cc. The patient tolerated procedure well and was returned to the recovery room in stable condition.

## 2015-09-16 NOTE — Progress Notes (Signed)
Patient ID: Kristina Davis, female   DOB: 25-Aug-1941, 74 y.o.   MRN: [id number] Patient is awake and alert no complaints of leg pain. Motor function is intact. Patient has ambulated and she is voiding. Continue supportive measures.

## 2015-09-16 NOTE — Transfer of Care (Signed)
Immediate Anesthesia Transfer of Care Note  Patient: Kristina Davis  Procedure(s) Performed: Procedure(s): LUMBAR FOUR-FIVE POSTERIOR LUMBAR INTERBODY FUSION WITH INTERBODY PROTHESIS POSTERIOR LATERAL ARTHRODESIS AND POSTERIOR NONSEGMENTAL INSTRUMENTATION  (N/A)  Patient Location: PACU  Anesthesia Type:General  Level of Consciousness: awake, patient cooperative and responds to stimulation  Airway & Oxygen Therapy: Patient Spontanous Breathing and Patient connected to nasal cannula oxygen  Post-op Assessment: Report given to RN, Post -op Vital signs reviewed and stable and Patient moving all extremities X 4  Post vital signs: Reviewed and stable  Last Vitals:  Filed Vitals:   09/16/15 0626  BP: 159/74  Pulse: 58  Temp: 36.1 C  Resp: 20    Complications: No apparent anesthesia complications

## 2015-09-16 NOTE — Progress Notes (Signed)
Utilization review completed.  

## 2015-09-16 NOTE — H&P (Signed)
Kristina Davis is an 74 y.o. female.   Chief Complaint: Back and bilateral leg pain HPI: Patient is a 74 year old individual who's had a known spondylolisthesis at L4-L5. She wished undergo as much conservative therapy and was reluctant to consider surgery initially. For the past year and a half she has been treated with chiropractic epidural steroidal injections physical therapy and all manner of conservative effort. Despite this she notes that there is been a progression of her symptoms now with weakness into both lower extremities. She was advised that ultimately she needs surgical decompression of a grade 2 spondylolisthesis at level of L4-L5. She is now admitted for this procedure.  Past Medical History  Diagnosis Date  . Hypertension   . Arthritis     Past Surgical History  Procedure Laterality Date  . Tubal ligation    .  bone spur on right heal Right     History reviewed. No pertinent family history. Social History:  reports that she has never smoked. She does not have any smokeless tobacco history on file. She reports that she drinks alcohol. She reports that she does not use illicit drugs.  Allergies:  Allergies  Allergen Reactions  . Dexamethasone Itching  . Doxycycline Itching  . Penicillins Itching and Other (See Comments)    Foot itch    Medications Prior to Admission  Medication Sig Dispense Refill  . aspirin EC 81 MG tablet Take 81 mg by mouth every other day.    . Calcium Carb-Cholecalciferol (CALCIUM + D3) 600-200 MG-UNIT TABS Take 1 tablet by mouth daily.    . cetirizine (ZYRTEC) 10 MG tablet Take 10 mg by mouth daily.    . hydrochlorothiazide (HYDRODIURIL) 25 MG tablet Take 25 mg by mouth daily.    . Multiple Vitamins-Minerals (WOMENS 50+ MULTI VITAMIN/MIN PO) Take 1 tablet by mouth daily.      No results found for this or any previous visit (from the past 48 hour(s)). No results found.  Review of Systems  HENT: Negative.   Eyes: Negative.   Respiratory:  Negative.   Cardiovascular: Negative.   Gastrointestinal: Negative.   Genitourinary: Negative.   Musculoskeletal: Positive for back pain.  Skin: Negative.   Neurological: Positive for weakness.       Or extremity pain with walking any distance  Psychiatric/Behavioral: Negative.     Blood pressure 159/74, pulse 58, temperature 96.9 F (36.1 C), temperature source Oral, resp. rate 20, height 5\' 2"  (1.575 m), weight 72.167 kg (159 lb 1.6 oz), SpO2 96 %. Physical Exam  Constitutional: She is oriented to person, place, and time. She appears well-developed and well-nourished.  HENT:  Head: Normocephalic and atraumatic.  Eyes: Conjunctivae and EOM are normal. Pupils are equal, round, and reactive to light.  Neck: Normal range of motion. Neck supple.  Cardiovascular: Normal rate and regular rhythm.   Respiratory: Effort normal and breath sounds normal.  GI: Soft. Bowel sounds are normal.  Musculoskeletal:  Positive straight leg raising at 30 fixed maneuver is negative bilaterally  Neurological: She is alert and oriented to person, place, and time.  Mild weakness in tibialis anterior groups bilaterally questionable weakness in the quadriceps bilaterally absent reflexes in the patellae and the Achilles both  Skin: Skin is warm and dry.  Psychiatric: She has a normal mood and affect. Her behavior is normal. Judgment and thought content normal.     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  Kristina Davis J 09/16/2015, 7:28 AM

## 2015-09-16 NOTE — Anesthesia Preprocedure Evaluation (Addendum)
Anesthesia Evaluation  Patient identified by MRN, date of birth, ID band Patient awake    Reviewed: Allergy & Precautions, H&P , NPO status , Patient's Chart, lab work & pertinent test results  History of Anesthesia Complications Negative for: history of anesthetic complications  Airway Mallampati: I  TM Distance: >3 FB Neck ROM: full    Dental no notable dental hx.    Pulmonary neg pulmonary ROS,    Pulmonary exam normal breath sounds clear to auscultation       Cardiovascular hypertension, Pt. on medications Normal cardiovascular exam Rhythm:regular Rate:Normal     Neuro/Psych negative neurological ROS     GI/Hepatic negative GI ROS, Neg liver ROS,   Endo/Other  negative endocrine ROS  Renal/GU negative Renal ROS     Musculoskeletal  (+) Arthritis ,   Abdominal   Peds  Hematology negative hematology ROS (+)   Anesthesia Other Findings   Reproductive/Obstetrics negative OB ROS                            Anesthesia Physical Anesthesia Plan  ASA: II  Anesthesia Plan: General   Post-op Pain Management:    Induction: Intravenous  Airway Management Planned: Oral ETT  Additional Equipment: None  Intra-op Plan:   Post-operative Plan: Extubation in OR  Informed Consent: I have reviewed the patients History and Physical, chart, labs and discussed the procedure including the risks, benefits and alternatives for the proposed anesthesia with the patient or authorized representative who has indicated his/her understanding and acceptance.   Dental Advisory Given  Plan Discussed with: Anesthesiologist, CRNA and Surgeon  Anesthesia Plan Comments:         Anesthesia Quick Evaluation

## 2015-09-16 NOTE — Anesthesia Postprocedure Evaluation (Signed)
Anesthesia Post Note  Patient: Kristina Davis  Procedure(s) Performed: Procedure(s) (LRB): LUMBAR FOUR-FIVE POSTERIOR LUMBAR INTERBODY FUSION WITH INTERBODY PROTHESIS POSTERIOR LATERAL ARTHRODESIS AND POSTERIOR NONSEGMENTAL INSTRUMENTATION  (N/A)  Patient location during evaluation: PACU Anesthesia Type: General Level of consciousness: awake and alert Pain management: pain level controlled Vital Signs Assessment: post-procedure vital signs reviewed and stable Respiratory status: spontaneous breathing, nonlabored ventilation, respiratory function stable and patient connected to nasal cannula oxygen Cardiovascular status: blood pressure returned to baseline and stable Postop Assessment: no signs of nausea or vomiting Anesthetic complications: no    Last Vitals:  Filed Vitals:   09/16/15 1100 09/16/15 1115  BP:    Pulse: 66 66  Temp:    Resp: 11 9    Last Pain:  Filed Vitals:   09/16/15 1140  PainSc: 10-Worst pain ever                 Zenaida Deed

## 2015-09-17 MED ORDER — OXYCODONE-ACETAMINOPHEN 5-325 MG PO TABS: 1.0000 | ORAL_TABLET | ORAL | Status: DC | PRN

## 2015-09-17 MED ORDER — DIAZEPAM 5 MG PO TABS: 5.0000 mg | ORAL_TABLET | Freq: Four times a day (QID) | ORAL | Status: DC | PRN

## 2015-09-17 MED ORDER — DEXAMETHASONE 1 MG PO TABS: ORAL_TABLET | ORAL | Status: DC

## 2015-09-17 MED ORDER — DEXAMETHASONE SODIUM PHOSPHATE 4 MG/ML IJ SOLN
4.0000 mg | Freq: Once | INTRAMUSCULAR | Status: AC
  Administered 2015-09-17: 4 mg via INTRAVENOUS
  Filled 2015-09-17: qty 1

## 2015-09-17 NOTE — Discharge Summary (Addendum)
Physician Discharge Summary  Patient ID: Kristina Davis MRN: [id number] DOB/AGE: [birth year] 74 y.o.  Admit date: 09/16/2015 Discharge date: 09/18/2015  Admission Diagnoses: Spondylolisthesis L4-L5 with stenosis and lumbar radiculopathy, neurogenic claudication  Discharge Diagnoses: Spondylolisthesis L4-L5 with stenosis and lumbar radiculopathy, neurogenic claudication  Active Problems:   Spondylolisthesis of lumbar region   Discharged Condition: good  Hospital Course: Patient underwent surgical decompression and stabilization at L4-L5 and she tolerated surgery well.  Consults: None  Significant Diagnostic Studies: None  Treatments: surgery: Bilateral decompression L4-L5 posterior lumbar interbody arthrodesis with peek spacers local autograft and allograft.  Discharge Exam: Blood pressure 106/57, pulse 74, temperature 97.6 F (36.4 C), temperature source Oral, resp. rate 18, height 5\' 2"  (1.575 m), weight 72.167 kg (159 lb 1.6 oz), SpO2 97 %. Incision is clean and dry. Motor function is intact in lower extremities in iliopsoas quadriceps tibialis anterior and gastrocs. Station and gait are normal.  Disposition: Discharge home  Discharge Instructions    Call MD for:  redness, tenderness, or signs of infection (pain, swelling, redness, odor or green/yellow discharge around incision site)    Complete by:  As directed      Call MD for:  severe uncontrolled pain    Complete by:  As directed      Call MD for:  temperature >100.4    Complete by:  As directed      Diet - low sodium heart healthy    Complete by:  As directed      Discharge instructions    Complete by:  As directed   Okay to shower. Do not apply salves or appointments to incision. No heavy lifting with the upper extremities greater than 15 pounds. May resume driving when not requiring pain medication and patient feels comfortable with doing so.     Increase activity slowly    Complete by:  As directed              Medication List    TAKE these medications        aspirin EC 81 MG tablet  Take 81 mg by mouth every other day.     Calcium + D3 600-200 MG-UNIT Tabs  Take 1 tablet by mouth daily.     cetirizine 10 MG tablet  Commonly known as:  ZYRTEC  Take 10 mg by mouth daily.     dexamethasone 1 MG tablet  Commonly known as:  DECADRON  2 tablets twice daily for 2 days, one tablet twice daily for 2 days, one tablet daily for 2 days.     diazepam 5 MG tablet  Commonly known as:  VALIUM  Take 1 tablet (5 mg total) by mouth every 6 (six) hours as needed for muscle spasms.     hydrochlorothiazide 25 MG tablet  Commonly known as:  HYDRODIURIL  Take 25 mg by mouth daily.     oxyCODONE-acetaminophen 5-325 MG tablet  Commonly known as:  PERCOCET/ROXICET  Take 1-2 tablets by mouth every 4 (four) hours as needed for moderate pain.     WOMENS 50+ MULTI VITAMIN/MIN PO  Take 1 tablet by mouth daily.         SignedEarleen Newport 09/17/2015, 12:11 PM

## 2015-09-17 NOTE — Evaluation (Signed)
Physical Therapy Evaluation Patient Details Name: Kristina Davis MRN: [id number] DOB: 09-30-1941 Today's Date: 09/17/2015   History of Present Illness  74 yo female s/p L4-5 Fusion with brace PMH: arthritis, HTN, R heal bone spur surgerical removal  Clinical Impression  Pt admitted with above diagnosis. Pt currently with functional limitations due to the deficits listed below (see PT Problem List). At the time of PT eval pt was able to perform transfers and ambulation at a gross supervision level for safety.Will continue to follow for at least 1 more PT session, as pt is mobilizing well. Pt will benefit from skilled PT to increase their independence and safety with mobility to allow discharge to the venue listed below.       Follow Up Recommendations Outpatient PT    Equipment Recommendations  None recommended by PT    Recommendations for Other Services       Precautions / Restrictions Precautions Precautions: Back Precaution Booklet Issued: Yes (comment) Precaution Comments: handout provided by OT and reviewed again during PT Required Braces or Orthoses: Spinal Brace Spinal Brace: Lumbar corset;Applied in sitting position Restrictions Weight Bearing Restrictions: No      Mobility  Bed Mobility               General bed mobility comments: Pt was sitting in recliner upon PT arrival.   Transfers Overall transfer level: Needs assistance Equipment used: None Transfers: Sit to/from Stand Sit to Stand: Supervision         General transfer comment: Supervision for safety. No unsteadiness noted or assist required.   Ambulation/Gait Ambulation/Gait assistance: Supervision Ambulation Distance (Feet): 400 Feet Assistive device: None Gait Pattern/deviations: Step-through pattern;Decreased stride length;Trunk flexed Gait velocity: Decreased Gait velocity interpretation: Below normal speed for age/gender General Gait Details: Slow but steady gait. Pt did not require any  assistance or demonstrate any imbalances during gait training.   Stairs Stairs: Yes Stairs assistance: Min guard Stair Management: No rails;Forwards (HHA with pt touching wall to simulate home environment) Number of Stairs: 10 General stair comments: Pt was able to negotiate 10 stairs without assist. HHA provided but pt did not put any weight through the hand.   Wheelchair Mobility    Modified Rankin (Stroke Patients Only)       Balance Overall balance assessment: Needs assistance Sitting-balance support: Feet supported;No upper extremity supported Sitting balance-Leahy Scale: Fair     Standing balance support: No upper extremity supported;During functional activity Standing balance-Leahy Scale: Fair                               Pertinent Vitals/Pain Pain Assessment: Faces Faces Pain Scale: Hurts a little bit Pain Location: Incision Pain Descriptors / Indicators: Operative site guarding;Discomfort Pain Intervention(s): Limited activity within patient's tolerance;Monitored during session;Repositioned    Home Living Family/patient expects to be discharged to:: Private residence Living Arrangements: Spouse/significant other Available Help at Discharge: Family;Available 24 hours/day Type of Home: House Home Access: Stairs to enter Entrance Stairs-Rails: None Entrance Stairs-Number of Steps: 6-7 Home Layout: One level Home Equipment: Shower seat;Adaptive equipment Additional Comments: pt will have family (A) 24/7 upon d/c    Prior Function Level of Independence: Independent               Hand Dominance   Dominant Hand: Right    Extremity/Trunk Assessment   Upper Extremity Assessment: Defer to OT evaluation  Lower Extremity Assessment: Overall WFL for tasks assessed      Cervical / Trunk Assessment: Other exceptions (s/p surg)  Communication   Communication: No difficulties  Cognition Arousal/Alertness: Awake/alert Behavior  During Therapy: WFL for tasks assessed/performed Overall Cognitive Status: Within Functional Limits for tasks assessed                      General Comments      Exercises        Assessment/Plan    PT Assessment Patient needs continued PT services  PT Diagnosis Difficulty walking;Generalized weakness   PT Problem List Decreased strength;Decreased range of motion;Decreased activity tolerance;Decreased balance;Decreased mobility;Decreased knowledge of use of DME;Decreased safety awareness;Decreased knowledge of precautions;Pain  PT Treatment Interventions DME instruction;Gait training;Stair training;Functional mobility training;Therapeutic activities;Therapeutic exercise;Neuromuscular re-education;Patient/family education   PT Goals (Current goals can be found in the Care Plan section) Acute Rehab PT Goals Patient Stated Goal: Home at d/c. PT Goal Formulation: With patient Time For Goal Achievement: 09/24/15 Potential to Achieve Goals: Good    Frequency Min 5X/week   Barriers to discharge        Co-evaluation               End of Session Equipment Utilized During Treatment: Back brace Activity Tolerance: Patient tolerated treatment well Patient left: in chair;with call bell/phone within reach Nurse Communication: Mobility status         Time: [id number] PT Time Calculation (min) (ACUTE ONLY): 14 min   Charges:   PT Evaluation $Initial PT Evaluation Tier I: 1 Procedure     PT G CodesRolinda Roan 2015/10/17, 10:01 AM   Rolinda Roan, PT, DPT Acute Rehabilitation Services Pager: [phone]

## 2015-09-17 NOTE — Progress Notes (Signed)
Patient ID: Kristina Davis, female   DOB: 01/23/1941, 74 y.o.   MRN: [id number] I will signs are stable Motor function is good Plan discharge today

## 2015-09-17 NOTE — Evaluation (Signed)
Occupational Therapy Evaluation Patient Details Name: Kristina Davis MRN: [id number] DOB: 1941-02-05 Today's Date: 09/17/2015    History of Present Illness 74 yo female s/p L4-5 Fusion with brace PMH: arthritis, HTN, R heal bone spur surgerical removal   Clinical Impression   Patient evaluated by Occupational Therapy with no further acute OT needs identified. All education has been completed and the patient has no further questions. See below for any follow-up Occupational Therapy or equipment needs. OT to sign off. Thank you for referral.   MD_ please address when patient is allowed to shower and concerns with traveling to 69 yo granddaughters birthday party     Follow Up Recommendations  No OT follow up    Equipment Recommendations  None recommended by OT    Recommendations for Other Services       Precautions / Restrictions Precautions Precautions: Back Precaution Comments: handout provided and reviewed Required Braces or Orthoses: Spinal Brace Spinal Brace: Lumbar corset;Applied in sitting position      Mobility Bed Mobility               General bed mobility comments: in chair on arrival. educated with demo of bed mobility  Transfers Overall transfer level: Needs assistance   Transfers: Sit to/from Stand Sit to Stand: Supervision              Balance                                            ADL Overall ADL's : Needs assistance/impaired     Grooming: Wash/dry hands;Wash/dry face;Oral care;Supervision/safety   Upper Body Bathing: Supervision/ safety   Lower Body Bathing: Supervison/ safety       Lower Body Dressing: Supervision/safety;Sit to/from stand Lower Body Dressing Details (indicate cue type and reason): able to cross bil LE Toilet Transfer: Supervision/safety;Ambulation;Regular Glass blower/designer Details (indicate cue type and reason): educated on toilet aide     Tub/ Shower Transfer: Minimal  assistance;Shower Scientist, research (medical) Details (indicate cue type and reason): educated on the need for shower seat due to sway with eye occlusion Functional mobility during ADLs: Min guard General ADL Comments: pt educated on adls with back handout without further questions at this time.      Vision     Perception     Praxis      Pertinent Vitals/Pain Pain Assessment: Faces Faces Pain Scale: Hurts a little bit Pain Descriptors / Indicators: Operative site guarding     Hand Dominance Right   Extremity/Trunk Assessment Upper Extremity Assessment Upper Extremity Assessment: Overall WFL for tasks assessed   Lower Extremity Assessment Lower Extremity Assessment: Overall WFL for tasks assessed   Cervical / Trunk Assessment Cervical / Trunk Assessment: Other exceptions (s/p surg)   Communication Communication Communication: No difficulties   Cognition Arousal/Alertness: Awake/alert Behavior During Therapy: WFL for tasks assessed/performed Overall Cognitive Status: Within Functional Limits for tasks assessed                     General Comments       Exercises       Shoulder Instructions      Home Living Family/patient expects to be discharged to:: Private residence Living Arrangements: Spouse/significant other Available Help at Discharge: Family;Available 24 hours/day Type of Home: House Home Access: Stairs to enter [employer] of Steps: 6-7 Entrance Stairs-Rails:  None Home Layout: One level     Bathroom Shower/Tub: Teacher, early years/pre: Standard     Home Equipment: Shower seat;Adaptive equipment Adaptive Equipment: Reacher;Other (Comment) (toilet aide) Additional Comments: pt will have family (A) 24/7 upon d/c      Prior Functioning/Environment Level of Independence: Independent             OT Diagnosis: Generalized weakness   OT Problem List:     OT Treatment/Interventions:      OT Goals(Current goals  can be found in the care plan section) Acute Rehab OT Goals Potential to Achieve Goals: Good  OT Frequency:     Barriers to D/C:            Co-evaluation              End of Session Nurse Communication: Mobility status;Precautions  Activity Tolerance: Patient tolerated treatment well Patient left: in chair;with call bell/phone within reach   Time: 0705-0751 OT Time Calculation (min): 46 min Charges:  OT General Charges $OT Visit: 1 Procedure OT Evaluation $Initial OT Evaluation Tier I: 1 Procedure OT Treatments $Self Care/Home Management : 23-37 mins G-Codes:    Parke Poisson B 09/21/2015, 8:05 AM    Jeri Modena   OTR/L PagerIP:[phone] Office: [phone] .

## 2015-09-18 NOTE — Progress Notes (Signed)
Patient ID: Kristina Davis, female   DOB: 19-Aug-1941, 74 y.o.   MRN: [id number] Patient apparently had a rash yesterday about cheeks with significant erythema. Patient's husband refused to take her home. She refuses take Decadron as she believes this is the culprit. We'll discontinue the Decadron at this time. She is cleared for discharge

## 2015-09-18 NOTE — Progress Notes (Signed)
Patient alert and oriented, mae's well, voiding adequate amount of urine, swallowing without difficulty, c/o moderate pain and meds given prior to discharged. Patient discharged home with family. Script and discharged instructions given to patient. Patient and family stated understanding of instructions given.  

## 2015-09-18 NOTE — Progress Notes (Signed)
Physical Therapy Treatment Patient Details Name: Kristina Davis MRN: [id number] DOB: 06-18-1941 Today's Date: 09/18/2015    History of Present Illness 74 yo female s/p L4-5 Fusion with brace PMH: arthritis, HTN, R heal bone spur surgerical removal    PT Comments    Pt progressing towards physical therapy goals. Was able to ambulate well this session with no unsteadiness or LOB noted. Pt demonstrated safe technique on the stairs. Will continue to follow.   Follow Up Recommendations  Outpatient PT     Equipment Recommendations  None recommended by PT    Recommendations for Other Services       Precautions / Restrictions Precautions Precautions: Back Precaution Booklet Issued: Yes (comment) Precaution Comments: handout provided by OT and reviewed again during PT Required Braces or Orthoses: Spinal Brace Spinal Brace: Lumbar corset;Applied in sitting position Restrictions Weight Bearing Restrictions: No    Mobility  Bed Mobility               General bed mobility comments: Pt was sitting in recliner upon PT arrival.   Transfers Overall transfer level: Needs assistance Equipment used: None Transfers: Sit to/from Stand Sit to Stand: Modified independent (Device/Increase time)         General transfer comment: No unsteadiness noted or assist required.   Ambulation/Gait Ambulation/Gait assistance: Modified independent (Device/Increase time) Ambulation Distance (Feet): 400 Feet Assistive device: None Gait Pattern/deviations: Step-through pattern;Decreased stride length;Trunk flexed Gait velocity: Decreased Gait velocity interpretation: Below normal speed for age/gender General Gait Details: Slow but steady gait. Pt did not require any assistance or demonstrate any imbalances during gait training.    Stairs Stairs: Yes Stairs assistance: Supervision Stair Management:  (HHA with pt touching wall to simulate home environment) Number of Stairs: 10 General stair  comments: Pt was able to negotiate 10 stairs without assist. HHA provided but pt did not put any weight through the hand.   Wheelchair Mobility    Modified Rankin (Stroke Patients Only)       Balance Overall balance assessment: Needs assistance Sitting-balance support: Feet supported;No upper extremity supported Sitting balance-Leahy Scale: Fair     Standing balance support: No upper extremity supported;During functional activity Standing balance-Leahy Scale: Fair                      Cognition Arousal/Alertness: Awake/alert Behavior During Therapy: WFL for tasks assessed/performed Overall Cognitive Status: Within Functional Limits for tasks assessed                      Exercises      General Comments        Pertinent Vitals/Pain Pain Assessment: 0-10 Pain Score: 2  Pain Location: Incision site Pain Descriptors / Indicators: Operative site guarding;Discomfort Pain Intervention(s): Limited activity within patient's tolerance;Monitored during session;Repositioned    Home Living                      Prior Function            PT Goals (current goals can now be found in the care plan section) Acute Rehab PT Goals Patient Stated Goal: Home at d/c. PT Goal Formulation: With patient Time For Goal Achievement: 09/24/15 Potential to Achieve Goals: Good Progress towards PT goals: Progressing toward goals    Frequency  Min 5X/week    PT Plan Current plan remains appropriate    Co-evaluation  End of Session Equipment Utilized During Treatment: Back brace Activity Tolerance: Patient tolerated treatment well Patient left: in chair;with call bell/phone within reach     Time: 0751-0809 PT Time Calculation (min) (ACUTE ONLY): 18 min  Charges:  $Gait Training: 8-22 mins                    G Codes:      Rolinda Roan 09-24-15, 12:19 PM   Rolinda Roan, PT, DPT Acute Rehabilitation Services Pager: [phone]

## 2016-02-04 DIAGNOSIS — I10 Essential (primary) hypertension: Secondary | ICD-10-CM | POA: Insufficient documentation

## 2016-05-06 ENCOUNTER — Encounter: Payer: Self-pay | Admitting: Podiatry

## 2016-05-06 ENCOUNTER — Ambulatory Visit (INDEPENDENT_AMBULATORY_CARE_PROVIDER_SITE_OTHER): Payer: Medicare Other | Admitting: Podiatry

## 2016-05-06 VITALS — BP 134/87 | HR 72 | Resp 16 | Ht 62.5 in | Wt 157.0 lb

## 2016-05-06 DIAGNOSIS — L600 Ingrowing nail: Secondary | ICD-10-CM

## 2016-05-06 DIAGNOSIS — B351 Tinea unguium: Secondary | ICD-10-CM

## 2016-05-06 NOTE — Progress Notes (Signed)
   Subjective:    Patient ID: Kristina Davis, female    DOB: 03/20/1941, 75 y.o.   MRN: [id number]  HPI Chief Complaint  Patient presents with  . Nail Problem    Bilateral; great toes; Left foot-both sides; Right foot-lateral; pt stated, "has had an ingrown toenail for a long time, pain started a month ago"      Review of Systems  All other systems reviewed and are negative.      Objective:   Physical Exam        Assessment & Plan:

## 2016-05-06 NOTE — Patient Instructions (Signed)

## 2016-05-07 NOTE — Progress Notes (Signed)
Subjective:     Patient ID: Kristina Davis, female   DOB: 07-14-1941, 75 y.o.   MRN: [id number]  HPI patient presents stating I have had my big toe nail corners removed left one time and now the nail is very incurvated thick and painful and my right hallux is very sore in the corner. I've tried to soak them and tried to trim them myself without relief of symptoms   Review of Systems  All other systems reviewed and are negative.      Objective:   Physical Exam  Constitutional: She is oriented to person, place, and time.  Cardiovascular: Intact distal pulses.   Musculoskeletal: Normal range of motion.  Neurological: She is oriented to person, place, and time.  Skin: Skin is warm.  Nursing note and vitals reviewed.  neurovascular status intact muscle strength adequate range of motion within normal limits with patient noted to have incurvated border right hallux lateral border and a thick damaged left hallux nail with history of having the corners removed with crusted tissue and pain when palpated. Patient's noted to have good digital perfusion and is well oriented 3     Assessment:     Ingrown toenail deformity right hallux lateral border with damaged left hallux nail that's painful when pressed    Plan:     H&P condition reviewed and options discussed. Patient wants nail removal and I did explain procedure and risk and today I went ahead infiltrated each hallux with 60 Milligan Xylocaine Marcaine mixture remove the lateral corner of the right hallux and the entire nail left hallux exposing matrix and applying phenol 5 applications left 3 applications right and applied alcohol lavage and sterile dressing. Gave instructions on soaks and reappoint

## 2016-05-15 ENCOUNTER — Telehealth: Payer: Self-pay | Admitting: *Deleted

## 2016-05-15 MED ORDER — AZITHROMYCIN 250 MG PO TABS: ORAL_TABLET | ORAL | 0 refills | Status: DC

## 2016-05-15 NOTE — Telephone Encounter (Addendum)
Pt states she had an ingrown procedure by Dr. Paulla Dolly and the to is now infected, red, swollen and draining cloudy with blood. I told pt to stop the antibacterial soaks and begin epsom salt soaks and I would talk to Dr. Paulla Dolly and call again. Dr. Paulla Dolly ordered z-pak. Informed pt.

## 2016-05-18 ENCOUNTER — Ambulatory Visit (INDEPENDENT_AMBULATORY_CARE_PROVIDER_SITE_OTHER): Payer: Medicare Other | Admitting: Podiatry

## 2016-05-18 ENCOUNTER — Encounter: Payer: Self-pay | Admitting: Podiatry

## 2016-05-18 VITALS — BP 131/88 | HR 71 | Resp 16

## 2016-05-18 DIAGNOSIS — L03012 Cellulitis of left finger: Secondary | ICD-10-CM

## 2016-05-18 DIAGNOSIS — L03032 Cellulitis of left toe: Secondary | ICD-10-CM

## 2016-05-19 NOTE — Progress Notes (Signed)
Subjective:     Patient ID: Kristina Davis, female   DOB: Mar 09, 1941, 75 y.o.   MRN: [id number]  HPI patient presents stating that she was concerned about the look of her left big toe and had some redness and is on an antibiotic   Review of Systems     Objective:   Physical Exam Neurovascular status unchanged negative Homans sign was noted with proximal erythema noted that is very localized within the nailbed with mild drainage but no indications of systemic process    Assessment:     Mild paronychia infection left hallux localized in nature    Plan:     Placed on Z-Pak and instructed on continued soaks and to utilize bandages and reappoint to recheck

## 2017-05-27 DIAGNOSIS — M65331 Trigger finger, right middle finger: Secondary | ICD-10-CM | POA: Insufficient documentation

## 2018-02-14 ENCOUNTER — Other Ambulatory Visit: Payer: Self-pay | Admitting: Obstetrics and Gynecology

## 2018-02-14 DIAGNOSIS — E2839 Other primary ovarian failure: Secondary | ICD-10-CM

## 2018-02-17 ENCOUNTER — Ambulatory Visit
Admission: RE | Admit: 2018-02-17 | Discharge: 2018-02-17 | Disposition: A | Discharge status: Home / Self Care | Payer: Medicare Other | Source: Ambulatory Visit | Attending: Obstetrics and Gynecology | Admitting: Obstetrics and Gynecology

## 2018-02-17 DIAGNOSIS — E2839 Other primary ovarian failure: Secondary | ICD-10-CM

## 2018-04-21 DIAGNOSIS — C4491 Basal cell carcinoma of skin, unspecified: Secondary | ICD-10-CM

## 2018-04-21 HISTORY — DX: Basal cell carcinoma of skin, unspecified: C44.91

## 2018-07-19 ENCOUNTER — Ambulatory Visit (INDEPENDENT_AMBULATORY_CARE_PROVIDER_SITE_OTHER): Payer: Self-pay

## 2018-07-19 ENCOUNTER — Ambulatory Visit (INDEPENDENT_AMBULATORY_CARE_PROVIDER_SITE_OTHER): Payer: Medicare Other | Admitting: Orthopaedic Surgery

## 2018-07-19 ENCOUNTER — Encounter (INDEPENDENT_AMBULATORY_CARE_PROVIDER_SITE_OTHER): Payer: Self-pay | Admitting: Orthopaedic Surgery

## 2018-07-19 VITALS — BP 148/90 | HR 66 | Ht 61.5 in | Wt 150.0 lb

## 2018-07-19 DIAGNOSIS — M25572 Pain in left ankle and joints of left foot: Secondary | ICD-10-CM

## 2018-07-19 NOTE — Progress Notes (Signed)
Office Visit Note   Patient: Kristina Davis           Date of Birth: 06-09-1941           MRN: [id number] Visit Date: 07/19/2018              Requested by: Aletha Halim., PA-C [address] [place] [address] PCP: Aletha Halim., PA-C   Assessment & Plan: Visit Diagnoses:  1. 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  Long discussion regarding treatment options.  Kristina Davis has had a prior resection of a similar spur from her opposite heel with good results but would like to avoid surgery.  We will try heel cups and even consider Voltaren gel.  Would like to try the heel cups first.  Suggested issues with her with a higher heeled to allow plantarflexion of the foot with ambulation  Follow-Up Instructions: Return if symptoms worsen or fail to improve.   Orders:  Orders Placed This Encounter  Procedures  . XR Foot Complete Left   No orders of the defined types were placed in this encounter.     Procedures: No procedures performed   Clinical Data: No additional findings.   Subjective: Chief Complaint  Patient presents with  . New Patient (Initial Visit)    L HEEL PAIN FOR 3 WEEKS, NO INJURY OR INJECTIONS, OR SURGERIES  Kristina Davis noted insidious onset of left heel pain within the last month.  No injury or trauma.  Pain seems to be worse when she dorsiflexes her foot.  She has had a prior resection of a heel spur from her opposite foot years ago with good results she is tried comfortable shoes which seems to help.  No numbness or tingling.  No plantar pain.  HPI  Review of Systems  Constitutional: Negative for fatigue and fever.  HENT: Negative for ear pain.   Eyes: Negative for pain.  Respiratory: Negative for cough and shortness of breath.   Cardiovascular: Negative for leg swelling.  Gastrointestinal: Negative for constipation and diarrhea.    Genitourinary: Negative for difficulty urinating.  Musculoskeletal: Negative for back pain and neck pain.  Skin: Negative for rash.  Allergic/Immunologic: Negative for food allergies.  Neurological: Negative for weakness and numbness.  Hematological: Does not bruise/bleed easily.  Psychiatric/Behavioral: Negative for sleep disturbance.     Objective: Vital Signs: BP (!) 148/90 (BP Location: Left Arm, Patient Position: Sitting, Cuff Size: Normal)   Pulse 66   Ht 5' 1.5" (1.562 m)   Wt 150 lb (68 kg)   BMI 27.88 kg/m   Physical Exam  Constitutional: She is oriented to person, place, and time. She appears well-developed and well-nourished.  HENT:  Mouth/Throat: Oropharynx is clear and moist.  Eyes: Pupils are equal, round, and reactive to light. EOM are normal.  Pulmonary/Chest: Effort normal.  Neurological: She is alert and oriented to person, place, and time.  Skin: Skin is warm and dry.  Psychiatric: She has a normal mood and affect. Her behavior is normal.    Ortho Exam awake alert and oriented x3.  Comfortable sitting on examination of the left foot there is prominence along the posterior heel.  No redness.  No skin changes.  Achilles tendon is intact. Specialty Comments:  No specialty comments available.  Imaging: Xr Foot Complete Left  Result Date: 07/19/2018 Of left foot were obtained in  several projections.  There is a large posterior heel spur some prominence of Boehler's angle and posterior angle of the os calcis.  No acute injury.  No plantar spurring.    PMFS History: Patient Active Problem List   Diagnosis Date Noted  . 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 Arthritis   . Hypertension     History reviewed. No pertinent family history.  Past Surgical History:  Procedure Laterality Date  .  bone spur on right heal Right   . HAND SURGERY    . TUBAL LIGATION     Social History   Occupational History   . Not on file  Tobacco Use  . Smoking status: Never Smoker  . Smokeless tobacco: Never Used  Substance and Sexual Activity  . Alcohol use: Yes    Comment: socially  . Drug use: No  . Sexual activity: Not on file

## 2018-08-26 ENCOUNTER — Ambulatory Visit (INDEPENDENT_AMBULATORY_CARE_PROVIDER_SITE_OTHER): Payer: Medicare Other | Admitting: Physician Assistant

## 2018-08-26 ENCOUNTER — Encounter (INDEPENDENT_AMBULATORY_CARE_PROVIDER_SITE_OTHER): Payer: Self-pay | Admitting: Physician Assistant

## 2018-08-26 ENCOUNTER — Ambulatory Visit (INDEPENDENT_AMBULATORY_CARE_PROVIDER_SITE_OTHER): Payer: Self-pay

## 2018-08-26 VITALS — Ht 61.5 in | Wt 150.0 lb

## 2018-08-26 DIAGNOSIS — M25561 Pain in right knee: Secondary | ICD-10-CM | POA: Diagnosis not present

## 2018-08-26 DIAGNOSIS — M1711 Unilateral primary osteoarthritis, right knee: Secondary | ICD-10-CM

## 2018-08-26 MED ORDER — METHYLPREDNISOLONE ACETATE 40 MG/ML IJ SUSP
40.0000 mg | INTRAMUSCULAR | Status: AC | PRN
Start: 2018-08-26 — End: 2018-08-26
  Administered 2018-08-26: 40 mg via INTRA_ARTICULAR

## 2018-08-26 MED ORDER — LIDOCAINE HCL 1 % IJ SOLN
5.0000 mL | INTRAMUSCULAR | Status: AC | PRN
  Administered 2018-08-26: 5 mL

## 2018-08-26 NOTE — Progress Notes (Signed)
Office Visit Note   Patient: Kristina Davis           Date of Birth: [birth year]           MRN: [id number] Visit Date: 08/26/2018              Requested by: Aletha Halim., PA-C [address] [place] [address] PCP: Aletha Halim., PA-C  Chief Complaint  Patient presents with  . Right Knee - Pain    NP; c/o knee pain      HPI: The patient is a 77 yo female here with her husband for evaluation of her right knee. She reports she has had some pain over the right knee and suspects she has some arthritis, but she was walking last night and turned or twisted on her right knee and feels like something popped and then had pain in the joint. She tried ice, heat and ibuprofen to the knee but the pain and some swelling persists. The pain radiates into her lower thigh and upper calf sometimes. The pain is along both the joint line and posteriorly. She did note some swelling.   Assessment & Plan: Visit Diagnoses:  1. Acute pain of right knee   2. Unilateral primary osteoarthritis, right knee     Plan: After informed consent the right knee was injected with lidocaine and Depo medrol under sterile techniques and the patient tolerated this well. We discussed that she may have torn cartilage or other internal derangement of the knee and may need MRI scan to rule this out.  She will follow up next Thursday or if doing well, will follow up in about 1 month.   Follow-Up Instructions: Return in about 6 days (around 09/01/2018).   Ortho Exam  Patient is alert, oriented, no adenopathy, well-dressed, normal affect, normal respiratory effort. Right knee with mild effusion medially. Pain along joint line . Range of motion 0-110 with pain on end range. No instability. Antalgic gait on the right.   Imaging: Xr Knee 1-2 Views Right  Result Date: 08/26/2018 Right knee 2 view shows arthritic changes with medial> lateral joint space narrowing, subchondral sclerosis, probable febella  posteriorly, severe patello femoral arthritic changes, no evidence of fracture.   No images are attached to the encounter.  Labs: No results found for: HGBA1C, ESRSEDRATE, CRP, LABURIC, REPTSTATUS, GRAMSTAIN, CULT, LABORGA   No results found for: ALBUMIN, PREALBUMIN, LABURIC  Body mass index is 27.88 kg/m.  Orders:  Orders Placed This Encounter  Procedures  . XR Knee 1-2 Views Right   No orders of the defined types were placed in this encounter.    Procedures: Large Joint Inj: R knee on 08/26/2018 4:39 PM Indications: pain and diagnostic evaluation Details: 22 G 1.5 in needle, anteromedial approach  Arthrogram: No  Medications: 5 mL lidocaine 1 %; 40 mg methylPREDNISolone acetate 40 MG/ML Outcome: tolerated well, no immediate complications Procedure, treatment alternatives, risks and benefits explained, specific risks discussed. Consent was given by the patient. Immediately prior to procedure a time out was called to verify the correct patient, procedure, equipment, support staff and site/side marked as required. Patient was prepped and draped in the usual sterile fashion.      Clinical Data: No additional findings.  ROS:  All other systems negative, except as noted in the HPI. Review of Systems  Objective: Vital Signs: Ht 5' 1.5" (1.562 m)   Wt 150 lb (68 kg)   BMI 27.88 kg/m   Specialty Comments:  No specialty comments available.  PMFS History: Patient Active Problem List   Diagnosis Date Noted  . 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 Arthritis   . Hypertension     History reviewed. No pertinent family history.  Past Surgical History:  Procedure Laterality Date  .  bone spur on right heal Right   . HAND SURGERY    . TUBAL LIGATION     Social History   Occupational History  . Not on file  Tobacco Use  . Smoking status: Never Smoker  . Smokeless tobacco: Never Used  Substance and Sexual  Activity  . Alcohol use: Yes    Comment: socially  . Drug use: No  . Sexual activity: Not on file

## 2018-09-01 ENCOUNTER — Encounter (INDEPENDENT_AMBULATORY_CARE_PROVIDER_SITE_OTHER): Payer: Self-pay | Admitting: Orthopedic Surgery

## 2018-09-01 ENCOUNTER — Ambulatory Visit (INDEPENDENT_AMBULATORY_CARE_PROVIDER_SITE_OTHER): Payer: Medicare Other | Admitting: Orthopedic Surgery

## 2018-09-01 VITALS — Ht 61.5 in | Wt 150.0 lb

## 2018-09-01 DIAGNOSIS — M1711 Unilateral primary osteoarthritis, right knee: Secondary | ICD-10-CM | POA: Diagnosis not present

## 2018-09-01 NOTE — Progress Notes (Signed)
   Office Visit Note   Patient: Kristina Davis           Date of Birth: 01/23/1941           MRN: [id number] Visit Date: 09/01/2018              Requested by: Aletha Halim., PA-C [address] [place] [address] PCP: Aletha Halim., PA-C  Chief Complaint  Patient presents with  . Right Knee - Follow-up      HPI: Patient is a 77 year old woman status post intra-articular right knee injection.  Patient states she feels 100% better she states she did get a flushed face after the first day.  She states she still has a little bit of pain but walks much better.  Assessment & Plan: Visit Diagnoses:  1. Unilateral primary osteoarthritis, right knee     Plan: Discussed the possibility of repeating injections as needed discussed the possibility of hyaluronic acid injections.  Continue strengthening and exercise.  Follow-Up Instructions: Return if symptoms worsen or fail to improve.   Ortho Exam  Patient is alert, oriented, no adenopathy, well-dressed, normal affect, normal respiratory effort. Examination patient has full range of motion of the right knee there is no redness no cellulitis there is no effusion collaterals and cruciates are stable.  Imaging: No results found. No images are attached to the encounter.  Labs: No results found for: HGBA1C, ESRSEDRATE, CRP, LABURIC, REPTSTATUS, GRAMSTAIN, CULT, LABORGA   No results found for: ALBUMIN, PREALBUMIN, LABURIC  Body mass index is 27.88 kg/m.  Orders:  No orders of the defined types were placed in this encounter.  No orders of the defined types were placed in this encounter.    Procedures: No procedures performed  Clinical Data: No additional findings.  ROS:  All other systems negative, except as noted in the HPI. Review of Systems  Objective: Vital Signs: Ht 5' 1.5" (1.562 m)   Wt 150 lb (68 kg)   BMI 27.88 kg/m   Specialty Comments:  No specialty comments available.  PMFS  History: Patient Active Problem List   Diagnosis Date Noted  . 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 Arthritis   . Hypertension     History reviewed. No pertinent family history.  Past Surgical History:  Procedure Laterality Date  .  bone spur on right heal Right   . HAND SURGERY    . TUBAL LIGATION     Social History   Occupational History  . Not on file  Tobacco Use  . Smoking status: Never Smoker  . Smokeless tobacco: Never Used  Substance and Sexual Activity  . Alcohol use: Yes    Comment: socially  . Drug use: No  . Sexual activity: Not on file

## 2019-01-05 ENCOUNTER — Encounter: Payer: Self-pay | Admitting: *Deleted

## 2019-04-17 ENCOUNTER — Ambulatory Visit (INDEPENDENT_AMBULATORY_CARE_PROVIDER_SITE_OTHER): Payer: Medicare Other | Admitting: Orthopedic Surgery

## 2019-04-17 ENCOUNTER — Other Ambulatory Visit: Payer: Self-pay

## 2019-04-17 ENCOUNTER — Ambulatory Visit: Payer: Self-pay

## 2019-04-17 ENCOUNTER — Encounter: Payer: Self-pay | Admitting: Orthopedic Surgery

## 2019-04-17 VITALS — Ht 61.0 in | Wt 150.0 lb

## 2019-04-17 DIAGNOSIS — M79672 Pain in left foot: Secondary | ICD-10-CM | POA: Diagnosis not present

## 2019-04-17 DIAGNOSIS — G8929 Other chronic pain: Secondary | ICD-10-CM

## 2019-04-17 DIAGNOSIS — M7662 Achilles tendinitis, left leg: Secondary | ICD-10-CM | POA: Diagnosis not present

## 2019-04-17 MED ORDER — LIDOCAINE HCL 1 % IJ SOLN
2.0000 mL | INTRAMUSCULAR | Status: AC | PRN
  Administered 2019-04-17: 2 mL

## 2019-04-17 MED ORDER — METHYLPREDNISOLONE ACETATE 40 MG/ML IJ SUSP
40.0000 mg | INTRAMUSCULAR | Status: AC | PRN
  Administered 2019-04-17: 40 mg via INTRA_ARTICULAR

## 2019-04-17 NOTE — Progress Notes (Signed)
Office Visit Note   Patient: Kristina Davis           Date of Birth: [birth year]           MRN: [id number] Visit Date: 04/17/2019              Requested by: Aletha Halim., PA-C [address],  [place] [address] PCP: Aletha Halim., PA-C  Chief Complaint  Patient presents with  . 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  Patient states she has tried diclofenac gel she has tried heel lifts she is tried therapy without relief.  Assessment & Plan: Visit Diagnoses:  1. Chronic heel pain, left   2. Achilles tendinitis, left leg     Plan: The retro-Achilles bursa was injected she tolerated this well follow-up as needed.  Patient states she would like to consider surgical intervention.  Discussed that we could proceed with arthroscopic debridement of the Haglund's deformity and heel spur.  She will call to set this up at her convenience.  Follow-Up Instructions: Return if symptoms worsen or fail to improve.   Ortho Exam  Patient is alert, oriented, no adenopathy, well-dressed, normal affect, normal respiratory effort. Examination patient has a good dorsalis pedis pulse she has a palpable defect but no skin skin defects.  She has dorsiflexion to neutral with her knee extended.  The Haglund's deformity is tender to palpation there is no nodular changes to the Achilles.  Patient states she has had a steroid injection in her left knee and developed some facial flushing from this she denies any swelling rash or shortness of breath.  Patient states that she would like to proceed with a injection we discussed the risks including potential for an allergic reaction and airway compromise.  Patient states she understands and wishes to proceed with the injection due to failure of all other treatments.  Imaging: Xr Os Calcis Left  Result Date:  04/17/2019 2 view radiographs of the left calcaneus shows a Haglund's deformity as well as a large bony spur.  No images are attached to the encounter.  Labs: No results found for: HGBA1C, ESRSEDRATE, CRP, LABURIC, REPTSTATUS, GRAMSTAIN, CULT, LABORGA   No results found for: ALBUMIN, PREALBUMIN, LABURIC  No results found for: MG No results found for: VD25OH  No results found for: PREALBUMIN CBC EXTENDED Latest Ref Rng & Units 09/10/2015  WBC 4.0 - 10.5 K/uL 8.0  RBC 3.87 - 5.11 MIL/uL 5.08  HGB 12.0 - 15.0 g/dL 15.4(H)  HCT 36.0 - 46.0 % 45.9  PLT 150 - 400 K/uL 236     Body mass index is 28.34 kg/m.  Orders:  Orders Placed This Encounter  Procedures  . XR Os Calcis Left   No orders of the defined types were placed in this encounter.    Procedures: Medium Joint Inj: L ankle on 04/17/2019 2:29 PM Indications: pain and diagnostic evaluation Details: 22 G 1.5 in needle, posterior approach Medications: 2 mL lidocaine 1 %; 40 mg methylPREDNISolone acetate 40 MG/ML Outcome: tolerated well, no immediate complications Procedure, treatment alternatives, risks and benefits explained, specific risks discussed. Consent was given by the patient. Immediately prior to procedure a time out was called to verify the correct patient, procedure, equipment, support staff and site/side marked as required. Patient was prepped and  draped in the usual sterile fashion.      Clinical Data: No additional findings.  ROS:  All other systems negative, except as noted in the HPI. Review of Systems  Objective: Vital Signs: Ht 5\' 1"  (1.549 m)   Wt 150 lb (68 kg)   BMI 28.34 kg/m   Specialty Comments:  No specialty comments available.  PMFS History: Patient Active Problem List   Diagnosis Date Noted  . 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 Arthritis   . BCC (basal cell carcinoma) nodular 04/21/2018   Left anterior crown  .  Hypertension     History reviewed. No pertinent family history.  Past Surgical History:  Procedure Laterality Date  .  bone spur on right heal Right   . HAND SURGERY    . TUBAL LIGATION     Social History   Occupational History  . Not on file  Tobacco Use  . Smoking status: Never Smoker  . Smokeless tobacco: Never Used  Substance and Sexual Activity  . Alcohol use: Yes    Comment: socially  . Drug use: No  . Sexual activity: Not on file

## 2019-04-21 ENCOUNTER — Ambulatory Visit: Payer: Medicare PPO | Admitting: Orthopaedic Surgery

## 2019-07-04 ENCOUNTER — Ambulatory Visit (INDEPENDENT_AMBULATORY_CARE_PROVIDER_SITE_OTHER): Payer: Medicare Other | Admitting: Orthopaedic Surgery

## 2019-07-04 ENCOUNTER — Encounter: Payer: Self-pay | Admitting: Orthopaedic Surgery

## 2019-07-04 ENCOUNTER — Other Ambulatory Visit: Payer: Self-pay

## 2019-07-04 VITALS — BP 151/84 | HR 65 | Ht 62.5 in | Wt 150.0 lb

## 2019-07-04 DIAGNOSIS — M7662 Achilles tendinitis, left leg: Secondary | ICD-10-CM | POA: Diagnosis not present

## 2019-07-04 DIAGNOSIS — M79672 Pain in left foot: Secondary | ICD-10-CM | POA: Diagnosis not present

## 2019-07-04 DIAGNOSIS — G8929 Other chronic pain: Secondary | ICD-10-CM

## 2019-07-04 NOTE — Progress Notes (Signed)
Office Visit Note   Patient: Kristina Davis           Date of Birth: Feb 02, 1941           MRN: [id number] Visit Date: 07/04/2019              Requested by: Aletha Halim., PA-C [address],  [place] [address] PCP: Aletha Halim., PA-C   Assessment & Plan: Visit Diagnoses:  1. Chronic heel pain, left   2. Achilles tendinitis, left leg   Long discussion regarding problem of the left heel.  I performed a similar surgery about 20 years ago on the right and she is done very well.  Surgery would include open excision of pump bump with debridement of any abnormal Achilles tendon attaching to the os calcis and the posterior angle of the os calcis.  I discussed the outpatient nature, splinting for several weeks and then a boot.  Limited weightbearing for up to 4 weeks.  I would like to obtain an MRI scan before proceeding with surgery  Plan: Follow-Up Instructions: Return We will schedule surgery after MRI scan left heel.   Orders:  Orders Placed This Encounter  Procedures  . MR Ankle Left w/o contrast   No orders of the defined types were placed in this encounter.     Procedures: No procedures performed   Clinical Data: No additional findings.   Subjective: Chief Complaint  Patient presents with  . Left Foot - Follow-up  Patient presents today for left heel pain. She was last evaluated with Dr.Vung Kush in October of last year, and saw Dr.Duda to discuss surgery in July of this year. Patient decided to not proceed with surgery at that time, but wants to talk about surgery with Dr.Lacoya Wilbanks.  I performed Achilles tendon surgery on her right heel about 20 years ago and has done very well. I did review the films of her left heel performed in July demonstrating a very prominent posterior heel spur and posterior angle of the os calcis  HPI  Review of Systems   Objective: Vital Signs: BP (!) 151/84   Pulse 65   Ht 5' 2.5" (1.588 m)   Wt 150 lb (68 kg)   BMI  27.00 kg/m   Physical Exam Constitutional:      Appearance: She is well-developed.  Eyes:     Pupils: Pupils are equal, round, and reactive to light.  Pulmonary:     Effort: Pulmonary effort is normal.  Skin:    General: Skin is warm and dry.  Neurological:     Mental Status: She is alert and oriented to person, place, and time.  Psychiatric:        Behavior: Behavior normal.     Ortho Exam awake alert and oriented x3.  Comfortable sitting.  Left heel with prominent pump bump.  Tenderness directly over the insertion of the Achilles tendon.  No redness.  Motor exam intact.  Little more prominence laterally than medially no pain along the Achilles tendon other than the insertion  Specialty Comments:  No specialty comments available.  Imaging: No results found.   PMFS History: Patient Active Problem List   Diagnosis Date Noted  . Achilles tendinitis, left leg 07/04/2019  . 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 Arthritis   . BCC (basal cell carcinoma) nodular 04/21/2018   Left anterior crown  . Hypertension     History reviewed. No  pertinent family history.  Past Surgical History:  Procedure Laterality Date  .  bone spur on right heal Right   . HAND SURGERY    . TUBAL LIGATION     Social History   Occupational History  . Not on file  Tobacco Use  . Smoking status: Never Smoker  . Smokeless tobacco: Never Used  Substance and Sexual Activity  . Alcohol use: Yes    Comment: socially  . Drug use: No  . Sexual activity: Not on file

## 2019-07-15 ENCOUNTER — Other Ambulatory Visit: Payer: Self-pay

## 2019-07-15 ENCOUNTER — Ambulatory Visit
Admission: RE | Admit: 2019-07-15 | Discharge: 2019-07-15 | Disposition: A | Discharge status: Home / Self Care | Payer: Medicare Other | Source: Ambulatory Visit | Attending: Orthopaedic Surgery | Admitting: Orthopaedic Surgery

## 2019-07-15 DIAGNOSIS — M7662 Achilles tendinitis, left leg: Secondary | ICD-10-CM

## 2019-07-15 DIAGNOSIS — G8929 Other chronic pain: Secondary | ICD-10-CM

## 2019-07-15 DIAGNOSIS — M79672 Pain in left foot: Secondary | ICD-10-CM

## 2019-07-18 ENCOUNTER — Ambulatory Visit (INDEPENDENT_AMBULATORY_CARE_PROVIDER_SITE_OTHER): Payer: Medicare Other | Admitting: Orthopaedic Surgery

## 2019-07-18 ENCOUNTER — Other Ambulatory Visit: Payer: Self-pay

## 2019-07-18 ENCOUNTER — Encounter: Payer: Self-pay | Admitting: Orthopaedic Surgery

## 2019-07-18 VITALS — BP 137/80 | HR 70 | Ht 62.5 in | Wt 150.0 lb

## 2019-07-18 DIAGNOSIS — M7662 Achilles tendinitis, left leg: Secondary | ICD-10-CM | POA: Diagnosis not present

## 2019-07-18 NOTE — Progress Notes (Signed)
Office Visit Note   Patient: Kristina Davis           Date of Birth: 03-30-1941           MRN: [id number] Visit Date: 07/18/2019              Requested by: Aletha Halim., PA-C [address],  [place] [address] PCP: Aletha Halim., PA-C   Assessment & Plan: Visit Diagnoses:  1. Achilles tendinitis, left leg     Plan: MRI scan of left ankle demonstrates chronic degeneration and hypertrophy of the distal Achilles tendon.  There is fluid in the retrocalcaneal bursa as well as a Haglund's deformity and edema superficial to the distal Achilles tendon consistent with peroneal tendinitis.  The plantar fascia was normal.  Ankle joint was normal.  There was a chronic focal longitudinal split tear of the peroneus brevis at the tip of the lateral malleolus.  Mrs. Pipkin is not symptomatic in that area.  Long discussion regarding the MRI scan.  We will plan surgery to explore the insertion of the Achilles tendon on the left os calcis.  I would remove the pump bump, debride the retrocalcaneal bursa and remove the posterior angle of the os calcis.  We talked about the period of immobilization and potential complications.  She has had a similar procedure years ago on the right and has done well.  I would expect the total period of immobilization about 6 weeks which he like to proceed.  We will schedule at her convenience  Follow-Up Instructions: Return We will schedule surgery.   Orders:  No orders of the defined types were placed in this encounter.  No orders of the defined types were placed in this encounter.     Procedures: No procedures performed   Clinical Data: No additional findings.   Subjective: Chief Complaint  Patient presents with  . Left Foot - Follow-up    MRI results  Patient presents today for a two week follow up on her left heel pain. She had an MRI on 07/17/2019, and is here today for those results. No changes since her last visit with her foot.   HPI   Review of Systems  Constitutional: Negative for fatigue.  HENT: Negative for ear pain.   Eyes: Negative for pain.  Respiratory: Negative for shortness of breath.   Cardiovascular: Negative for leg swelling.  Gastrointestinal: Negative for constipation and diarrhea.  Endocrine: Negative for cold intolerance and heat intolerance.  Genitourinary: Negative for difficulty urinating.  Musculoskeletal: Negative for joint swelling.  Skin: Negative for rash.  Allergic/Immunologic: Negative for food allergies.  Neurological: Negative for weakness.  Hematological: Does not bruise/bleed easily.  Psychiatric/Behavioral: Negative for sleep disturbance.     Objective: Vital Signs: BP 137/80   Pulse 70   Ht 5' 2.5" (1.588 m)   Wt 150 lb (68 kg)   BMI 27.00 kg/m   Physical Exam Constitutional:      Appearance: She is well-developed.  Eyes:     Pupils: Pupils are equal, round, and reactive to light.  Pulmonary:     Effort: Pulmonary effort is normal.  Skin:    General: Skin is warm and dry.  Neurological:     Mental Status: She is alert and oriented to person, place, and time.  Psychiatric:        Behavior: Behavior normal.     Ortho Exam left heel with a prominent pump bump.  No redness.  There is  tenderness at the attachment of the Achilles tendon to the os calcis.  Skin intact.  Neurologically intact.  No pain along the flexor tendons behind the medial malleolus.  Specifically, no tenderness or swelling along the peroneal tendons.  Neurologically intact.  No loss of ankle motion.  No pain with subtalar movement  Specialty Comments:  No specialty comments available.  Imaging: No results found.   PMFS History: Patient Active Problem List   Diagnosis Date Noted  . Achilles tendinitis, left leg 07/04/2019  . 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 Arthritis   . BCC (basal cell carcinoma) nodular 04/21/2018   Left anterior  crown  . Hypertension     History reviewed. No pertinent family history.  Past Surgical History:  Procedure Laterality Date  .  bone spur on right heal Right   . HAND SURGERY    . TUBAL LIGATION     Social History   Occupational History  . Not on file  Tobacco Use  . Smoking status: Never Smoker  . Smokeless tobacco: Never Used  Substance and Sexual Activity  . Alcohol use: Yes    Comment: socially  . Drug use: No  . Sexual activity: Not on file

## 2019-07-27 ENCOUNTER — Encounter: Payer: Self-pay | Admitting: Orthopaedic Surgery

## 2019-07-27 ENCOUNTER — Other Ambulatory Visit: Payer: Self-pay | Admitting: Orthopedic Surgery

## 2019-07-27 DIAGNOSIS — M7662 Achilles tendinitis, left leg: Secondary | ICD-10-CM | POA: Diagnosis not present

## 2019-07-27 DIAGNOSIS — M7730 Calcaneal spur, unspecified foot: Secondary | ICD-10-CM

## 2019-07-27 MED ORDER — HYDROCODONE-ACETAMINOPHEN 5-325 MG PO TABS: 1.0000 | ORAL_TABLET | ORAL | 0 refills | Status: DC | PRN

## 2019-08-03 ENCOUNTER — Ambulatory Visit (INDEPENDENT_AMBULATORY_CARE_PROVIDER_SITE_OTHER): Payer: Medicare Other | Admitting: Orthopaedic Surgery

## 2019-08-03 ENCOUNTER — Encounter: Payer: Self-pay | Admitting: Orthopaedic Surgery

## 2019-08-03 ENCOUNTER — Other Ambulatory Visit: Payer: Self-pay

## 2019-08-03 DIAGNOSIS — M7662 Achilles tendinitis, left leg: Secondary | ICD-10-CM

## 2019-08-03 NOTE — Progress Notes (Signed)
   Office Visit Note   Patient: Kristina Davis           Date of Birth: Dec 29, 1940           MRN: [id number] Visit Date: 08/03/2019              Requested by: Aletha Halim., PA-C [address]  [place] [address] PCP: Aletha Halim., PA-C   Assessment & Plan: Visit Diagnoses:  1. Achilles tendinitis, left leg     Plan: 1 week post excision of pump bump and debridement of Achilles insertional tendinitis.  Doing well.  No calf pain.  Incision healing without problem.  Will apply another bulky dressing and equalizer boot.  Nonweightbearing.  Office 1 week Follow-Up Instructions: Return in about 1 week (around 08/10/2019).   Orders:  No orders of the defined types were placed in this encounter.  No orders of the defined types were placed in this encounter.     Procedures: No procedures performed   Clinical Data: No additional findings.   Subjective: Chief Complaint  Patient presents with  . Left Ankle - Routine Post Op    Left Achilles debridement DOS 07/27/2019  Patient presents today for follow up on her left ankle. She had surgery on her Achilles on 07/27/2019. She is now one week out from surgery. Patient states that she is doing okay. She is taking over-the-counter medicines.  HPI  Review of Systems   Objective: Vital Signs: There were no vitals taken for this visit.  Physical Exam  Ortho Exam left heel incision healing without problem.  Sensory exam intact.  Achilles tendon intact no edema.  No calf pain Specialty Comments:  No specialty comments available.  Imaging: No results found.   PMFS History: Patient Active Problem List   Diagnosis Date Noted  . Achilles tendinitis, left leg 07/04/2019  . 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 Arthritis   . BCC (basal cell carcinoma) nodular 04/21/2018   Left anterior crown  . Hypertension     History reviewed. No pertinent family  history.  Past Surgical History:  Procedure Laterality Date  .  bone spur on right heal Right   . HAND SURGERY    . TUBAL LIGATION     Social History   Occupational History  . Not on file  Tobacco Use  . Smoking status: Never Smoker  . Smokeless tobacco: Never Used  Substance and Sexual Activity  . Alcohol use: Yes    Comment: socially  . Drug use: No  . Sexual activity: Not on file

## 2019-08-10 ENCOUNTER — Encounter: Payer: Self-pay | Admitting: Orthopaedic Surgery

## 2019-08-10 ENCOUNTER — Ambulatory Visit (INDEPENDENT_AMBULATORY_CARE_PROVIDER_SITE_OTHER): Payer: Medicare Other | Admitting: Orthopaedic Surgery

## 2019-08-10 DIAGNOSIS — M7662 Achilles tendinitis, left leg: Secondary | ICD-10-CM

## 2019-08-10 NOTE — Progress Notes (Signed)
Patient here today for post op check s/p surgery on left achilles  Staples intact. Complains of a lot of soreness in her heel and very tender to touch.

## 2019-08-10 NOTE — Progress Notes (Signed)
   Office Visit Note   Patient: Kristina Davis           Date of Birth: [birth year]           MRN: [id number] Visit Date: 08/10/2019              Requested by: Aletha Halim., PA-C [address],  [place] [address] PCP: Aletha Halim., PA-C   Assessment & Plan: Visit Diagnoses:  1. Achilles tendinitis, left leg     Plan: 2 weeks status post expiration of Achilles tendon at its insertion with excision of abnormal tendon and ectopic calcification.  Boehler's angle was also excised.  Doing well.  I removed the staples and applied Steri-Strips over benzoin.  Will place her in equalizer boot with a heel buildup using felt pads.  She will still maintain limited weightbearing and have her return in 2 weeks.  Start range of motion exercises out of the boot when she is off her feet as outlined  Follow-Up Instructions: Return in about 2 weeks (around 08/24/2019).   Orders:  No orders of the defined types were placed in this encounter.  No orders of the defined types were placed in this encounter.     Procedures: No procedures performed   Clinical Data: No additional findings.   Subjective: Chief Complaint  Patient presents with  . Post-op Follow-up    HPI: Patient here today for post op check s/p surgery on left achilles  Staples intact. Complains of a lot of soreness in her heel and very tender to touch.  No related fever or chills.  No calf pain  Review of Systems   Objective: Vital Signs: There were no vitals taken for this visit.  Physical Exam  Ortho Exam left para Achilles incision healing without problem.  Clips removed and Steri-Strips applied.  Motor and sensory exam intact.  Mild swelling of the ankle.  No evidence of infection  Specialty Comments:  No specialty comments available.  Imaging: No results found.   PMFS History: Patient Active Problem List   Diagnosis Date Noted  . Achilles tendinitis, left leg 07/04/2019  . Spondylolisthesis  of lumbar region 09/16/2015   Past Medical History:  Diagnosis Date  . Arthritis   . BCC (basal cell carcinoma) nodular 04/21/2018   Left anterior crown  . Hypertension     History reviewed. No pertinent family history.  Past Surgical History:  Procedure Laterality Date  .  bone spur on right heal Right   . HAND SURGERY    . TUBAL LIGATION     Social History   Occupational History  . Not on file  Tobacco Use  . Smoking status: Never Smoker  . Smokeless tobacco: Never Used  Substance and Sexual Activity  . Alcohol use: Yes    Comment: socially  . Drug use: No  . Sexual activity: Not on file

## 2019-08-24 ENCOUNTER — Ambulatory Visit (INDEPENDENT_AMBULATORY_CARE_PROVIDER_SITE_OTHER): Payer: Medicare Other | Admitting: Orthopaedic Surgery

## 2019-08-24 ENCOUNTER — Encounter: Payer: Self-pay | Admitting: Orthopaedic Surgery

## 2019-08-24 ENCOUNTER — Other Ambulatory Visit: Payer: Self-pay

## 2019-08-24 VITALS — Ht 62.0 in | Wt 150.0 lb

## 2019-08-24 DIAGNOSIS — M7662 Achilles tendinitis, left leg: Secondary | ICD-10-CM

## 2019-08-24 NOTE — Progress Notes (Signed)
Office Visit Note   Patient: Kristina Davis           Date of Birth: Mar 11, 1941           MRN: [id number] Visit Date: 08/24/2019              Requested by: Aletha Halim., PA-C [address],  [place] [address] PCP: Aletha Halim., PA-C   Assessment & Plan: Visit Diagnoses:  1. Achilles tendinitis, left leg     Plan: 1 month status post expiration of the Achilles insertion on the left os calcis with excision of degenerative tendon and areas of ectopic calcification.  Are also debrided Boehler's angle.  Doing very well.  Still using 2 crutches in equalizer boot but no problems or significant pain.  Prior pain has resolved.  May weight-bear with 1 crutch and after 2 more weeks can just walk with the equalizer boot.  We will see back in 1 month  Follow-Up Instructions: Return in about 1 month (around 09/23/2019).   Orders:  No orders of the defined types were placed in this encounter.  No orders of the defined types were placed in this encounter.     Procedures: No procedures performed   Clinical Data: No additional findings.   Subjective: Chief Complaint  Patient presents with  . Left Ankle - Routine Post Op    Left Achilles exploration DOS 07/27/2019  Patient presents today for follow up on her left ankle. She is now 4 weeks out from left Achilles exploration. Patient states that she has minimal pain. She is wearing an equalizer boot and nonweightbearing on her left side.   HPI  Review of Systems  Constitutional: Negative for fatigue.  HENT: Negative for ear pain.   Eyes: Negative for pain.  Respiratory: Negative for shortness of breath.   Cardiovascular: Positive for leg swelling.  Gastrointestinal: Negative for constipation and diarrhea.  Endocrine: Negative for cold intolerance and heat intolerance.  Genitourinary: Negative for difficulty urinating.  Musculoskeletal: Negative for joint swelling.  Skin: Negative for rash.   Allergic/Immunologic: Negative for food allergies.  Neurological: Negative for weakness.  Hematological: Does not bruise/bleed easily.  Psychiatric/Behavioral: Negative for sleep disturbance.     Objective: Vital Signs: Ht 5\' 2"  (1.575 m)   Wt 150 lb (68 kg)   BMI 27.44 kg/m   Physical Exam  Ortho Exam lateral left heel incision is healing without problem.  No evidence of infection.  Neurologically intact.  No significant swelling..  Motor and sensory exam intact.  Able to dorsiflex about 5 degrees Achilles is intact  Specialty Comments:  No specialty comments available.  Imaging: No results found.   PMFS History: Patient Active Problem List   Diagnosis Date Noted  . Achilles tendinitis, left leg 07/04/2019  . 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 Arthritis   . BCC (basal cell carcinoma) nodular 04/21/2018   Left anterior crown  . Hypertension     History reviewed. No pertinent family history.  Past Surgical History:  Procedure Laterality Date  .  bone spur on right heal Right   . HAND SURGERY    . TUBAL LIGATION     Social History   Occupational History  . Not on file  Tobacco Use  . Smoking status: Never Smoker  . Smokeless tobacco: Never Used  Substance and Sexual Activity  . Alcohol use: Yes    Comment: socially  .  Drug use: No  . Sexual activity: Not on file       

## 2019-09-20 ENCOUNTER — Encounter: Payer: Self-pay | Admitting: Orthopaedic Surgery

## 2019-09-20 ENCOUNTER — Ambulatory Visit (INDEPENDENT_AMBULATORY_CARE_PROVIDER_SITE_OTHER): Payer: Medicare Other | Admitting: Orthopaedic Surgery

## 2019-09-20 ENCOUNTER — Other Ambulatory Visit: Payer: Self-pay

## 2019-09-20 VITALS — Ht 62.0 in | Wt 150.0 lb

## 2019-09-20 DIAGNOSIS — M7662 Achilles tendinitis, left leg: Secondary | ICD-10-CM

## 2019-09-20 NOTE — Progress Notes (Signed)
   Office Visit Note   Patient: Kristina Davis           Date of Birth: 18-Oct-1940           MRN: [id number] Visit Date: 09/20/2019              Requested by: Aletha Halim., PA-C [address],  [place] [address] PCP: Aletha Halim., PA-C   Assessment & Plan: Visit Diagnoses:  1. Achilles tendinitis, left leg     Plan: 60-month status post expiration of Achilles tendon insertion with excision of ectopic calcification and abnormal tendon.  Also resected posterior angle of the os calcis.  Doing well.  Out of the boot now about 10 days and walking with a regular shoe.  No complaints.  Continue with range of motion and strengthening exercises and return to the office in 1 month.  Can try Voltaren gel if she is having any achiness or soreness  Follow-Up Instructions: Return in about 1 month (around 10/21/2019).   Orders:  No orders of the defined types were placed in this encounter.  No orders of the defined types were placed in this encounter.     Procedures: No procedures performed   Clinical Data: No additional findings.   Subjective: Chief Complaint  Patient presents with  . Left Ankle - Follow-up    Left Achilles exploration DOS 07/27/2019  Patient presents today for follow up on her left ankle. She had left achilles exploration on 07/27/2019. She is now 8 weeks out from surgery. She said that she has improved a lot. She still has some swelling. She cannot wear any shoes that have a back on them. She also states that it feels warm to the touch. She takes Ibuprofen as needed. She is not in therapy, but does her home exercises.  HPI  Review of Systems   Objective: Vital Signs: Ht 5\' 2"  (1.575 m)   Wt 150 lb (68 kg)   BMI 27.44 kg/m   Physical Exam  Ortho Exam left heel incision is healed without any problem.  There is still some prominence of the os calcis posteriorly but no redness.  Neurologically intact .Marland Kitchen  Achilles tendon intact.  Specialty  Comments:  No specialty comments available.  Imaging: No results found.   PMFS History: Patient Active Problem List   Diagnosis Date Noted  . Achilles tendinitis, left leg 07/04/2019  . 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 Arthritis   . BCC (basal cell carcinoma) nodular 04/21/2018   Left anterior crown  . Hypertension     History reviewed. No pertinent family history.  Past Surgical History:  Procedure Laterality Date  .  bone spur on right heal Right   . HAND SURGERY    . TUBAL LIGATION     Social History   Occupational History  . Not on file  Tobacco Use  . Smoking status: Never Smoker  . Smokeless tobacco: Never Used  Substance and Sexual Activity  . Alcohol use: Yes    Comment: socially  . Drug use: No  . Sexual activity: Not on file

## 2019-11-01 ENCOUNTER — Other Ambulatory Visit: Payer: Self-pay

## 2019-11-01 ENCOUNTER — Ambulatory Visit (INDEPENDENT_AMBULATORY_CARE_PROVIDER_SITE_OTHER): Payer: Medicare PPO | Admitting: Orthopaedic Surgery

## 2019-11-01 ENCOUNTER — Encounter: Payer: Self-pay | Admitting: Orthopaedic Surgery

## 2019-11-01 VITALS — Ht 62.0 in | Wt 150.0 lb

## 2019-11-01 DIAGNOSIS — M7662 Achilles tendinitis, left leg: Secondary | ICD-10-CM | POA: Diagnosis not present

## 2019-11-01 MED ORDER — LIDOCAINE HCL 1 % IJ SOLN
1.0000 mL | INTRAMUSCULAR | Status: AC | PRN
Start: 2019-11-01 — End: 2019-11-01
  Administered 2019-11-01: 15:00:00 1 mL

## 2019-11-01 NOTE — Progress Notes (Signed)
Office Visit Note   Patient: Kristina Davis           Date of Birth: March 31, 1941           MRN: [id number] Visit Date: 11/01/2019              Requested by: Aletha Halim., PA-C [address]  [place] [address] PCP: Aletha Halim., PA-C   Assessment & Plan: Visit Diagnoses:  1. Achilles tendinitis, left leg     Plan: 3 months status post expiration of the left Achilles insertion on the os calcis with removal of ectopic calcification abnormal tendon and the posterior angle of the os calcis.  Also remove the pump bump prominence laterally.  Still having some discomfort although no redness.  Definitely better than she was before surgery.  Having some tenderness along the medial parapatellar region.  I wonder if she does not have some scar tissue in the retrocalcaneal region I will try cortisone injection in that area.  Flexor tendons in the left and lateral tendons are intact.  Can try heel cups and comfortable shoes  Follow-Up Instructions: Return if symptoms worsen or fail to improve.   Orders:  Orders Placed This Encounter  Procedures  . Trigger Point Inj   No orders of the defined types were placed in this encounter.     Procedures: Trigger Point Inj  Date/Time: 11/01/2019 2:44 PM Performed by: Garald Balding, MD Authorized by: Garald Balding, MD   Consent Given by:  Patient Indications:  Pain Total # of Trigger Points:  1 Location: lower extremity   Needle Size:  27 G Approach:  Medial Medications #1:  1 mL lidocaine 1 % Comments: 20 mg Depo-Medrol injected with Xylocaine the medial parapatellar tendon and retrocalcaneal region     Clinical Data: No additional findings.   Subjective: Chief Complaint  Patient presents with  . Left Ankle - Follow-up  Patient presents today for follow up on her left ankle. She had a left Achilles exploration on 07/27/2019. She is now three months out from surgery. She said that her pain has improved, but  still hurting on each side of her ankle. She said that it a  "weird" feeling if you rub along her Achilles tendon. She cannot wear shoes that are closed in around the heel area.   HPI  Review of Systems   Objective: Vital Signs: Ht 5\' 2"  (1.575 m)   Wt 150 lb (68 kg)   BMI 27.44 kg/m   Physical Exam Constitutional:      Appearance: She is well-developed.  Eyes:     Pupils: Pupils are equal, round, and reactive to light.  Pulmonary:     Effort: Pulmonary effort is normal.  Skin:    General: Skin is warm and dry.  Neurological:     Mental Status: She is alert and oriented to person, place, and time.  Psychiatric:        Behavior: Behavior normal.     Ortho Exam left foot without any swelling.  Her incision laterally has healed beautifully.  There is no redness about the Achilles tendon and it is intact.  There is some tenderness in the retrocalcaneal region along the medial para Achilles tendon.  No swelling or ecchymosis.  No pain along the posterior tibial tendon or laterally in the peroneal tendons.  Still some prominence of the posterior aspect the os calcis but no tenderness in that area or redness  Specialty  Comments:  No specialty comments available.  Imaging: No results found.   PMFS History: Patient Active Problem List   Diagnosis Date Noted  . Achilles tendinitis, left leg 07/04/2019  . 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 Arthritis   . BCC (basal cell carcinoma) nodular 04/21/2018   Left anterior crown  . Hypertension     History reviewed. No pertinent family history.  Past Surgical History:  Procedure Laterality Date  .  bone spur on right heal Right   . HAND SURGERY    . TUBAL LIGATION     Social History   Occupational History  . Not on file  Tobacco Use  . Smoking status: Never Smoker  . Smokeless tobacco: Never Used  Substance and Sexual Activity  . Alcohol use: Yes    Comment: socially   . Drug use: No  . Sexual activity: Not on file

## 2019-11-02 ENCOUNTER — Ambulatory Visit: Payer: Medicare PPO | Attending: Internal Medicine

## 2019-11-02 DIAGNOSIS — Z23 Encounter for immunization: Secondary | ICD-10-CM | POA: Insufficient documentation

## 2019-11-02 NOTE — Progress Notes (Signed)
   Covid-19 Vaccination Clinic  Name:  Kristina Davis    MRN: [id number] DOB: Feb 18, 1941  11/02/2019  [name] was observed post Covid-19 immunization for 15 minutes without incidence. She was provided with Vaccine Information Sheet and instruction to access the V-Safe system.   [name] was instructed to call 911 with any severe reactions post vaccine: Marland Kitchen Difficulty breathing  . Swelling of your face and throat  . A fast heartbeat  . A bad rash all over your body  . Dizziness and weakness    Immunizations Administered    Name Date Dose VIS Date Route   Pfizer COVID-19 Vaccine 11/02/2019  1:13 PM 0.3 mL 09/22/2019 Intramuscular   Manufacturer: [place]   Lot: [id number]   [place]: [id number]

## 2019-11-04 ENCOUNTER — Ambulatory Visit: Payer: Medicare Other

## 2019-11-23 ENCOUNTER — Ambulatory Visit: Payer: Medicare PPO | Attending: Internal Medicine

## 2019-11-23 DIAGNOSIS — Z23 Encounter for immunization: Secondary | ICD-10-CM | POA: Insufficient documentation

## 2019-11-23 NOTE — Progress Notes (Signed)
   Covid-19 Vaccination Clinic  Name:  Kristina Davis    MRN: [id number] DOB: [birth year]  11/23/2019  [name] was observed post Covid-19 immunization for 15 minutes without incidence. She was provided with Vaccine Information Sheet and instruction to access the V-Safe system.   [name] was instructed to call 911 with any severe reactions post vaccine: Marland Kitchen Difficulty breathing  . Swelling of your face and throat  . A fast heartbeat  . A bad rash all over your body  . Dizziness and weakness    Immunizations Administered    Name Date Dose VIS Date Route   Pfizer COVID-19 Vaccine 11/23/2019  1:46 PM 0.3 mL 09/22/2019 Intramuscular   Manufacturer: [place]   Lot: [id number]   [place]: [id number]

## 2020-02-01 ENCOUNTER — Encounter: Payer: Self-pay | Admitting: Orthopaedic Surgery

## 2020-02-01 ENCOUNTER — Other Ambulatory Visit: Payer: Self-pay

## 2020-02-01 ENCOUNTER — Ambulatory Visit: Payer: Medicare PPO | Admitting: Orthopaedic Surgery

## 2020-02-01 DIAGNOSIS — M25572 Pain in left ankle and joints of left foot: Secondary | ICD-10-CM | POA: Diagnosis not present

## 2020-02-01 NOTE — Progress Notes (Signed)
Office Visit Note   Patient: Kristina Davis           Date of Birth: [birth year]           MRN: [id number] Visit Date: 02/01/2020              Requested by: Aletha Halim., PA-C [address],  [place] [address] PCP: Aletha Halim., PA-C   Assessment & Plan: Visit Diagnoses:  1. Pain in left ankle and joints of left foot     Plan: [name] is about 6 months status post excision of a pump pump and calcification within the Achilles tendon left heel.  She is doing well from that standpoint he really does not have any issues other than some slight decreased sensibility around the posterior aspect of her heel.  She has, however, developed some pain behind the medial malleolus.  On occasion she will have pain and swelling and oftentimes finds that she is limping.  She might very well have a tear of the posterior tibial tendon.  Will order an MRI scan.  I do not think this is related to her prior surgery  Follow-Up Instructions: Return After MRI scan left ankle.   Orders:  Orders Placed This Encounter  Procedures  . MR Ankle Left w/o contrast   No orders of the defined types were placed in this encounter.     Procedures: No procedures performed   Clinical Data: No additional findings.   Subjective: Chief Complaint  Patient presents with  . Left Foot - Pain  Patient presents today for left heel pain. She said that her pain is located at her medial heel. She said that her Achilles feels numb when she rubs that area. She has a history of an Achilles exploration on 07/27/2019. She said that her ankle swells when she keeps it down or does any walking. She will occasionally take Ibuprofen if needed. She has tried her CAM boot and states that it helps while wearing it, but the pain returns when she stops wearing it.  She further localizes the pain behind the medial malleolus where she has some discomfort and swelling on occasion depending upon her activities.  No  numbness or tingling.  She does not experience the pain that she was having before she had the pump bump surgery HPI  Review of Systems  Constitutional: Negative for fatigue.  HENT: Negative for ear pain.   Eyes: Negative for pain.  Respiratory: Negative for shortness of breath.   Cardiovascular: Negative for leg swelling.  Gastrointestinal: Negative for constipation and diarrhea.  Endocrine: Negative for cold intolerance and heat intolerance.  Genitourinary: Negative for difficulty urinating.  Musculoskeletal: Positive for joint swelling.  Skin: Negative for rash.  Allergic/Immunologic: Negative for food allergies.  Neurological: Negative for weakness.  Hematological: Does not bruise/bleed easily.  Psychiatric/Behavioral: Negative for sleep disturbance.     Objective: Vital Signs: Ht 5\' 2"  (1.575 m)   Wt 150 lb (68 kg)   BMI 27.44 kg/m   Physical Exam Constitutional:      Appearance: She is well-developed.  Eyes:     Pupils: Pupils are equal, round, and reactive to light.  Pulmonary:     Effort: Pulmonary effort is normal.  Skin:    General: Skin is warm and dry.  Neurological:     Mental Status: She is alert and oriented to person, place, and time.  Psychiatric:        Behavior: Behavior normal.  Ortho Exam left foot wrist with some persistent prominence of the posterior heel but no skin changes or pain.  Slight decreased sensibility related to her prior surgery.  Achilles tendon is intact.  Her pain is localized just behind the medial malleolus in the distribution of the posterior tibial tendon.  There was minimal swelling today and discomfort along about an inch of the tendon directly behind the malleolus.  Plantarflexion and inversion function intact.  Skin intact.  Neurologically intact Specialty Comments:  No specialty comments available.  Imaging: No results found.   PMFS History: Patient Active Problem List   Diagnosis Date Noted  . Pain in left ankle  and joints of left foot 02/01/2020  . Achilles tendinitis, left leg 07/04/2019  . 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 Arthritis   . BCC (basal cell carcinoma) nodular 04/21/2018   Left anterior crown  . Hypertension     History reviewed. No pertinent family history.  Past Surgical History:  Procedure Laterality Date  .  bone spur on right heal Right   . HAND SURGERY    . TUBAL LIGATION     Social History   Occupational History  . Not on file  Tobacco Use  . Smoking status: Never Smoker  . Smokeless tobacco: Never Used  Substance and Sexual Activity  . Alcohol use: Yes    Comment: socially  . Drug use: No  . Sexual activity: Not on file

## 2020-02-09 ENCOUNTER — Encounter: Payer: Self-pay | Admitting: Orthopaedic Surgery

## 2020-02-28 ENCOUNTER — Other Ambulatory Visit: Payer: Self-pay

## 2020-02-28 ENCOUNTER — Ambulatory Visit
Admission: RE | Admit: 2020-02-28 | Discharge: 2020-02-28 | Disposition: A | Discharge status: Home / Self Care | Payer: Medicare PPO | Source: Ambulatory Visit | Attending: Orthopaedic Surgery | Admitting: Orthopaedic Surgery

## 2020-02-28 DIAGNOSIS — M25572 Pain in left ankle and joints of left foot: Secondary | ICD-10-CM

## 2020-03-05 ENCOUNTER — Other Ambulatory Visit: Payer: Self-pay

## 2020-03-05 ENCOUNTER — Ambulatory Visit: Payer: Medicare PPO | Admitting: Orthopaedic Surgery

## 2020-03-05 ENCOUNTER — Encounter: Payer: Self-pay | Admitting: Orthopaedic Surgery

## 2020-03-05 VITALS — Ht 62.0 in | Wt 150.0 lb

## 2020-03-05 DIAGNOSIS — M7662 Achilles tendinitis, left leg: Secondary | ICD-10-CM | POA: Diagnosis not present

## 2020-03-05 NOTE — Progress Notes (Signed)
Office Visit Note   Patient: Kristina Davis           Date of Birth: December 30, 1940           MRN: [id number] Visit Date: 03/05/2020              Requested by: Aletha Halim., PA-C [address]  [place] [address] PCP: Aletha Halim., PA-C   Assessment & Plan: Visit Diagnoses:  1. Achilles tendinitis, left leg     Plan: Mrs. Pirozzi had an MRI scan of her left ankle.  She has been experiencing pain along the medial aspect of her ankle and os calcis.  She is not having any pain in the area of the Achilles insertional surgery.  The MRI scan did not demonstrate any abnormality of the flexor tendons.  There is a little tendinosis of the medial bundle of the Achilles probably related to her prior surgery.  She is not experiencing any pain about the posterior aspect of her Achilles earlier in the the tendon itself and the swelling has subsided.  I am not sure of a very plausible answer for pain and it is so localized right along the os calcis.  Could be some local scar tissue.  Given her several choices including Voltaren gel or CBD oil or salon pass.  She seems more comfortable in a shoe with a little bit of a heel then with a flat but she is got great motion of her Achilles and does not appear to be tight and no pain  with dorsiflexion of her foot.  The prominence of the Achilles is much less than it was and perhaps, it will continue to improve over the next several months.  She would like to give a little bit more time and then come back.  She will continue to work on stretching of her Achilles and may be using some heat or ice at the end of the day as well  Follow-Up Instructions: Return if symptoms worsen or fail to improve.   Orders:  No orders of the defined types were placed in this encounter.  No orders of the defined types were placed in this encounter.     Procedures: No procedures performed   Clinical Data: No additional findings.   Subjective: Chief Complaint   Patient presents with  . Left Ankle - Follow-up    MRI results  Patient presents today for follow up on her left ankle. She had an MRI on 02/28/2020 and is here today to discuss those results. No changes since her last visit. She is taking Ibuprofen as needed for pain.  Much more comfortable when she wears a shoe with a little bit of a heel.  Does take ibuprofen in the morning.  She like to get back into walking again and I have suggested that she might want to use a shoe with a little bit of a heel rather than a flat sandal  HPI  Review of Systems  Constitutional: Negative for fatigue.  HENT: Negative for ear pain.   Eyes: Negative for pain.  Respiratory: Negative for shortness of breath.   Cardiovascular: Negative for leg swelling.  Gastrointestinal: Negative for constipation and diarrhea.  Endocrine: Negative for cold intolerance and heat intolerance.  Genitourinary: Negative for difficulty urinating.  Musculoskeletal: Positive for joint swelling.  Skin: Negative for rash.  Allergic/Immunologic: Negative for food allergies.  Neurological: Negative for weakness.  Hematological: Does not bruise/bleed easily.  Psychiatric/Behavioral: Negative for  sleep disturbance.     Objective: Vital Signs: Ht 5\' 2"  (1.575 m)   Wt 150 lb (68 kg)   BMI 27.44 kg/m   Physical Exam Constitutional:      Appearance: She is well-developed.  Eyes:     Pupils: Pupils are equal, round, and reactive to light.  Pulmonary:     Effort: Pulmonary effort is normal.  Skin:    General: Skin is warm and dry.  Neurological:     Mental Status: She is alert and oriented to person, place, and time.  Psychiatric:        Behavior: Behavior normal.     Ortho Exam left foot and ankle.  No dorsal edema of the foot neurologically intact.  No pain along the metatarsal phalangeal joints.  No pain about the Achilles insertion where she had surgery.  The lateral incision is healing without problem.  Medially there is  no Tinel's or any sensory loss.  No swelling or pain along the flexor tendons.  No pain at the deltoid ligament or even laterally.  1 localized area of tenderness along the os calcis just anterior to the medial insertion of the Achilles but no induration or redness or skin change.  No ecchymosis.  No mass formation.  Achilles insertion prominence seems a lot less than it was when she was even her in her last office visit and no pain about the Achilles insertion either medially directly posteriorly or laterally.  Has excellent dorsiflexion of her ankle of well over 30 degrees without pain  Specialty Comments:  No specialty comments available.  Imaging: No results found.   PMFS History: Patient Active Problem List   Diagnosis Date Noted  . Pain in left ankle and joints of left foot 02/01/2020  . Achilles tendinitis, left leg 07/04/2019  . 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 Arthritis   . BCC (basal cell carcinoma) nodular 04/21/2018   Left anterior crown  . Hypertension     History reviewed. No pertinent family history.  Past Surgical History:  Procedure Laterality Date  .  bone spur on right heal Right   . HAND SURGERY    . TUBAL LIGATION     Social History   Occupational History  . Not on file  Tobacco Use  . Smoking status: Never Smoker  . Smokeless tobacco: Never Used  Substance and Sexual Activity  . Alcohol use: Yes    Comment: socially  . Drug use: No  . Sexual activity: Not on file

## 2020-05-09 ENCOUNTER — Other Ambulatory Visit: Payer: Self-pay | Admitting: Family Medicine

## 2020-05-09 DIAGNOSIS — E2839 Other primary ovarian failure: Secondary | ICD-10-CM

## 2020-05-21 LAB — COLOGUARD: Cologuard: POSITIVE — AB

## 2020-05-25 LAB — COLOGUARD: COLOGUARD: POSITIVE — AB

## 2020-05-31 ENCOUNTER — Encounter: Payer: Self-pay | Admitting: Gastroenterology

## 2020-07-15 ENCOUNTER — Ambulatory Visit (INDEPENDENT_AMBULATORY_CARE_PROVIDER_SITE_OTHER): Payer: Medicare PPO | Admitting: Podiatry

## 2020-07-15 ENCOUNTER — Encounter: Payer: Self-pay | Admitting: Podiatry

## 2020-07-15 ENCOUNTER — Other Ambulatory Visit: Payer: Self-pay

## 2020-07-15 ENCOUNTER — Ambulatory Visit (INDEPENDENT_AMBULATORY_CARE_PROVIDER_SITE_OTHER): Payer: Medicare PPO

## 2020-07-15 DIAGNOSIS — M7752 Other enthesopathy of left foot: Secondary | ICD-10-CM | POA: Diagnosis not present

## 2020-07-15 DIAGNOSIS — M7662 Achilles tendinitis, left leg: Secondary | ICD-10-CM

## 2020-07-15 DIAGNOSIS — M779 Enthesopathy, unspecified: Secondary | ICD-10-CM

## 2020-07-17 ENCOUNTER — Ambulatory Visit (AMBULATORY_SURGERY_CENTER): Payer: Self-pay | Admitting: *Deleted

## 2020-07-17 ENCOUNTER — Other Ambulatory Visit: Payer: Self-pay

## 2020-07-17 VITALS — Ht 62.0 in | Wt 155.0 lb

## 2020-07-17 DIAGNOSIS — Z1211 Encounter for screening for malignant neoplasm of colon: Secondary | ICD-10-CM

## 2020-07-17 DIAGNOSIS — R195 Other fecal abnormalities: Secondary | ICD-10-CM

## 2020-07-17 MED ORDER — NA SULFATE-K SULFATE-MG SULF 17.5-3.13-1.6 GM/177ML PO SOLN: 1.0000 | Freq: Once | ORAL | 0 refills | Status: AC

## 2020-07-17 NOTE — Progress Notes (Signed)
Subjective:   Patient ID: Kristina Davis, female   DOB: 79 y.o.   MRN: [id number]   HPI Patient presents stating that she has pain across the back of her left heel and states that she had previous bone spur surgery done and that helped for a while but it seems like it is hurting on the opposite side.  Patient is found to have no current smoking and does like to be active if possible   Review of Systems  All other systems reviewed and are negative.       Objective:  Physical Exam Vitals and nursing note reviewed.  Constitutional:      Appearance: She is well-developed.  Pulmonary:     Effort: Pulmonary effort is normal.  Musculoskeletal:        General: Normal range of motion.  Skin:    General: Skin is warm.  Neurological:     Mental Status: She is alert.     Neurovascular status intact with patient's lateral posterior pain reduced where the surgery was done but quite a bit of discomfort on the medial side of the Achilles.  She does have moderate equinus with inflammation noted across this area     Assessment:  Acute Achilles tendinitis medial side right     Plan:  H&P reviewed condition sterile prep done and explained injection and risk.  Since it is the opposite side I do think this is reasonable and hopefully we can avoid any further surgery and today I did sterile prep and then injected the area with 3 mg dexamethasone 5 mg Xylocaine  X-rays indicate satisfactory resection of bone on the posterior aspect of the left heel

## 2020-07-17 NOTE — Progress Notes (Signed)

## 2020-07-18 ENCOUNTER — Encounter: Payer: Self-pay | Admitting: Gastroenterology

## 2020-07-29 ENCOUNTER — Encounter: Payer: Self-pay | Admitting: Podiatry

## 2020-07-29 ENCOUNTER — Ambulatory Visit: Payer: Medicare PPO | Admitting: Podiatry

## 2020-07-29 ENCOUNTER — Other Ambulatory Visit: Payer: Self-pay

## 2020-07-29 DIAGNOSIS — M779 Enthesopathy, unspecified: Secondary | ICD-10-CM

## 2020-07-29 DIAGNOSIS — M7662 Achilles tendinitis, left leg: Secondary | ICD-10-CM

## 2020-07-29 NOTE — Progress Notes (Signed)
Subjective:   Patient ID: Kristina Davis, female   DOB: 79 y.o.   MRN: [id number]   HPI Patient states she is feeling much better and is very pleased so far with how this is doing.  Patient states it will swell if she is on it for long periods of time but overall much improved   ROS      Objective:  Physical Exam  Neurovascular status intact with significant diminishment of discomfort in the left Achilles tendon medial side with no current swelling or drainage associated with it     Assessment:  Improvement Achilles tendinitis left posterior     Plan:  H&P reviewed condition discussed continued physical therapy anti-inflammatories and topical medicines along with heel lift.  Discussed shoe gear modifications and what we will do is if symptoms were to recur but at this point I am satisfied and we will take a wait-and-see attitude.  Patient will be seen back to recheck

## 2020-07-31 ENCOUNTER — Ambulatory Visit (AMBULATORY_SURGERY_CENTER): Payer: Medicare PPO | Admitting: Gastroenterology

## 2020-07-31 ENCOUNTER — Other Ambulatory Visit: Payer: Self-pay

## 2020-07-31 ENCOUNTER — Encounter: Payer: Self-pay | Admitting: Gastroenterology

## 2020-07-31 VITALS — BP 126/76 | HR 62 | Temp 96.0°F | Resp 19 | Ht 62.0 in | Wt 155.0 lb

## 2020-07-31 DIAGNOSIS — D125 Benign neoplasm of sigmoid colon: Secondary | ICD-10-CM

## 2020-07-31 DIAGNOSIS — D124 Benign neoplasm of descending colon: Secondary | ICD-10-CM | POA: Diagnosis not present

## 2020-07-31 DIAGNOSIS — R195 Other fecal abnormalities: Secondary | ICD-10-CM | POA: Diagnosis not present

## 2020-07-31 DIAGNOSIS — D120 Benign neoplasm of cecum: Secondary | ICD-10-CM

## 2020-07-31 DIAGNOSIS — D123 Benign neoplasm of transverse colon: Secondary | ICD-10-CM | POA: Diagnosis not present

## 2020-07-31 MED ORDER — SODIUM CHLORIDE 0.9 % IV SOLN: 500.0000 mL | INTRAVENOUS | Status: DC

## 2020-07-31 NOTE — Op Note (Addendum)
[facility] Patient Name: Aloha Bartok Procedure Date: 07/31/2020 8:17 AM MRN: [id number] Endoscopist: Ladene Artist , MD Age: 79 Referring MD:  Date of Birth: 11/27/1940 Gender: Female Account #: [id number] Procedure:                Colonoscopy Indications:              Positive Cologuard test Medicines:                Monitored Anesthesia Care Procedure:                Pre-Anesthesia Assessment:                           - Prior to the procedure, a History and Physical                            was performed, and patient medications and                            allergies were reviewed. The patient's tolerance of                            previous anesthesia was also reviewed. The risks                            and benefits of the procedure and the sedation                            options and risks were discussed with the patient.                            All questions were answered, and informed consent                            was obtained. Prior Anticoagulants: The patient has                            taken no previous anticoagulant or antiplatelet                            agents. ASA Grade Assessment: II - A patient with                            mild systemic disease. After reviewing the risks                            and benefits, the patient was deemed in                            satisfactory condition to undergo the procedure.                           After obtaining informed consent, the colonoscope  was passed under direct vision. Throughout the                            procedure, the patient's blood pressure, pulse, and                            oxygen saturations were monitored continuously. The                            Colonoscope was introduced through the anus and                            advanced to the the cecum, identified by                            appendiceal orifice and ileocecal valve. The                             ileocecal valve, appendiceal orifice, and rectum                            were photographed. The quality of the bowel                            preparation was good. The colonoscopy was performed                            without difficulty. The patient tolerated the                            procedure well. Scope In: 8:29:02 AM Scope Out: 8:51:57 AM Scope Withdrawal Time: 0 hours 19 minutes 25 seconds  Total Procedure Duration: 0 hours 22 minutes 55 seconds  Findings:                 The perianal and digital rectal examinations were                            normal.                           A 4 mm polyp was found in the cecum. The polyp was                            sessile. The polyp was removed with a cold biopsy                            forceps. Resection and retrieval were complete.                           Five sessile polyps were found in the sigmoid colon                            (2) and transverse colon (3). The polyps were 5 to  8 mm in size. These polyps were removed with a cold                            snare. Resection and retrieval were complete.                           A 20 mm polyp was found in the descending colon.                            The polyp was sessile. The polyp was removed with a                            hot snare. Resection and retrieval were complete. 2                            areas were tattooed with an injection of 2.5 mL of                            Spot (carbon black) just proximal and just distal                            to the polypectomy site.                           Multiple small-mouthed diverticula were found in                            the left colon. There was narrowing of the colon in                            association with the diverticular opening. There                            was evidence of diverticular spasm. There was no                             evidence of diverticular bleeding.                           Internal hemorrhoids were found during                            retroflexion. The hemorrhoids were small and Grade                            I (internal hemorrhoids that do not prolapse).                           The exam was otherwise without abnormality on                            direct and retroflexion views. Complications:            No  immediate complications. Estimated blood loss:                            None. Estimated Blood Loss:     Estimated blood loss: none. Impression:               - One 4 mm polyp in the cecum, removed with a cold                            biopsy forceps. Resected and retrieved.                           - Five 5 to 8 mm polyps in the sigmoid colon and in                            the transverse colon, removed with a cold snare.                            Resected and retrieved.                           - One 20 mm polyp in the descending colon, removed                            with a hot snare. Resected and retrieved. Tattooed.                           - Moderate diverticulosis in the left colon.                           - Internal hemorrhoids.                           - The examination was otherwise normal on direct                            and retroflexion views. Recommendation:           - Repeat colonoscopy after studies are complete for                            surveillance based on pathology results.                           - Patient has a contact number available for                            emergencies. The signs and symptoms of potential                            delayed complications were discussed with the                            patient. Return to normal activities tomorrow.  Written discharge instructions were provided to the                            patient.                           - High fiber diet.                            - Continue present medications.                           - Await pathology results.                           - No aspirin, ibuprofen, naproxen, or other                            non-steroidal anti-inflammatory drugs for 2 weeks                            after polyp removal. Ladene Artist, MD 07/31/2020 8:58:49 AM This report has been signed electronically.

## 2020-07-31 NOTE — Progress Notes (Signed)
Called to room to assist during endoscopic procedure.  Patient ID and intended procedure confirmed with present staff. Received instructions for my participation in the procedure from the performing physician.  

## 2020-07-31 NOTE — Patient Instructions (Signed)
Information on polyps, diverticulosis and hemorrhoids given to you today  Await pathology results.  Resume previous diet and medications.  Eat a high fiber diet.   Avoid NSAIDS (Aspirin, Ibuprofen, Aleve, Naproxen), you may use Tylenol as needed for the next two weeks.    YOU HAD AN ENDOSCOPIC PROCEDURE TODAY AT [facility]:   Refer to the procedure report that was given to you for any specific questions about what was found during the examination.  If the procedure report does not answer your questions, please call your gastroenterologist to clarify.  If you requested that your care partner not be given the details of your procedure findings, then the procedure report has been included in a sealed envelope for you to review at your convenience later.  YOU SHOULD EXPECT: Some feelings of bloating in the abdomen. Passage of more gas than usual.  Walking can help get rid of the air that was put into your GI tract during the procedure and reduce the bloating. If you had a lower endoscopy (such as a colonoscopy or flexible sigmoidoscopy) you may notice spotting of blood in your stool or on the toilet paper. If you underwent a bowel prep for your procedure, you may not have a normal bowel movement for a few days.  Please Note:  You might notice some irritation and congestion in your nose or some drainage.  This is from the oxygen used during your procedure.  There is no need for concern and it should clear up in a day or so.  SYMPTOMS TO REPORT IMMEDIATELY:   Following lower endoscopy (colonoscopy or flexible sigmoidoscopy):  Excessive amounts of blood in the stool  Significant tenderness or worsening of abdominal pains  Swelling of the abdomen that is new, acute  Fever of 100F or higher   For urgent or emergent issues, a gastroenterologist can be reached at any hour by calling [phone]. Do not use MyChart messaging for urgent concerns.    DIET:  We do recommend a  small meal at first, but then you may proceed to your regular diet.  Drink plenty of fluids but you should avoid alcoholic beverages for 24 hours.  ACTIVITY:  You should plan to take it easy for the rest of today and you should NOT DRIVE or use heavy machinery until tomorrow (because of the sedation medicines used during the test).    FOLLOW UP: Our staff will call the number listed on your records 48-72 hours following your procedure to check on you and address any questions or concerns that you may have regarding the information given to you following your procedure. If we do not reach you, we will leave a message.  We will attempt to reach you two times.  During this call, we will ask if you have developed any symptoms of COVID 19. If you develop any symptoms (ie: fever, flu-like symptoms, shortness of breath, cough etc.) before then, please call [phone].  If you test positive for Covid 19 in the 2 weeks post procedure, please call and report this information to [place].    If any biopsies were taken you will be contacted by phone or by letter within the next 1-3 weeks.  Please call us at [phone] if you have not heard about the biopsies in 3 weeks.    SIGNATURES/CONFIDENTIALITY: You and/or your care partner have signed paperwork which will be entered into your electronic medical record.  These signatures attest to the fact that that  the information above on your After Visit Summary has been reviewed and is understood.  Full responsibility of the confidentiality of this discharge information lies with you and/or your care-partner.

## 2020-07-31 NOTE — Progress Notes (Signed)
A and O x3. Report to RN. Tolerated MAC anesthesia well.

## 2020-07-31 NOTE — Progress Notes (Signed)
Vs SH I have reviewed the patient's medical history in detail and updated the computerized patient record. 

## 2020-08-02 ENCOUNTER — Telehealth: Payer: Self-pay

## 2020-08-02 NOTE — Telephone Encounter (Signed)
°  Follow up Call-  Call back number 07/31/2020  Post procedure Call Back phone  # [phone]  Permission to leave phone message Yes  Some recent data might be hidden     Patient questions:  Do you have a fever, pain , or abdominal swelling? No. Pain Score  0 *  Have you tolerated food without any problems? Yes.    Have you been able to return to your normal activities? Yes.    Do you have any questions about your discharge instructions: Diet   No. Medications  No. Follow up visit  No.  Do you have questions or concerns about your Care? No.  Actions: * If pain score is 4 or above: No action needed, pain <4.  1. Have you developed a fever since your procedure? no  2.   Have you had an respiratory symptoms (SOB or cough) since your procedure? no  3.   Have you tested positive for COVID 19 since your procedure no  4.   Have you had any family members/close contacts diagnosed with the COVID 19 since your procedure?  no   If yes to any of these questions please route to Joylene John, RN and Joella Prince, RN

## 2020-08-07 ENCOUNTER — Ambulatory Visit
Admission: RE | Admit: 2020-08-07 | Discharge: 2020-08-07 | Disposition: A | Discharge status: Home / Self Care | Payer: Medicare PPO | Source: Ambulatory Visit | Attending: Family Medicine | Admitting: Family Medicine

## 2020-08-07 ENCOUNTER — Other Ambulatory Visit: Payer: Self-pay

## 2020-08-07 DIAGNOSIS — E2839 Other primary ovarian failure: Secondary | ICD-10-CM

## 2020-08-08 ENCOUNTER — Encounter: Payer: Self-pay | Admitting: Gastroenterology

## 2021-01-21 ENCOUNTER — Ambulatory Visit: Payer: Medicare PPO | Admitting: Physician Assistant

## 2021-01-21 ENCOUNTER — Other Ambulatory Visit: Payer: Self-pay

## 2021-01-21 ENCOUNTER — Encounter: Payer: Self-pay | Admitting: Physician Assistant

## 2021-01-21 DIAGNOSIS — C4492 Squamous cell carcinoma of skin, unspecified: Secondary | ICD-10-CM

## 2021-01-21 DIAGNOSIS — Z85828 Personal history of other malignant neoplasm of skin: Secondary | ICD-10-CM

## 2021-01-21 DIAGNOSIS — L570 Actinic keratosis: Secondary | ICD-10-CM | POA: Diagnosis not present

## 2021-01-21 DIAGNOSIS — L820 Inflamed seborrheic keratosis: Secondary | ICD-10-CM | POA: Diagnosis not present

## 2021-01-21 DIAGNOSIS — Z1283 Encounter for screening for malignant neoplasm of skin: Secondary | ICD-10-CM | POA: Diagnosis not present

## 2021-01-21 DIAGNOSIS — D0439 Carcinoma in situ of skin of other parts of face: Secondary | ICD-10-CM | POA: Diagnosis not present

## 2021-01-21 DIAGNOSIS — D485 Neoplasm of uncertain behavior of skin: Secondary | ICD-10-CM

## 2021-01-21 HISTORY — DX: Squamous cell carcinoma of skin, unspecified: C44.92

## 2021-01-21 NOTE — Progress Notes (Signed)
Follow-Up Visit   Subjective  Kristina Davis is a 80 y.o. female who presents for the following: Skin Problem (Per patient she found a bump on her scalp x 3 weeks ago per patient no bleeding, no pain. Per patient she had a BCC removed from her scalp a few years ago so she would like this lesion checked. Patient would also like the brown lesions on her face checked x years no bleeding, check lesions on her left posterior neck/shoulder that are getting caught on clothing no bleeding.  Also check lesions on her legs x 1 year off and on that bleed when shaving. ).   The following portions of the chart were reviewed this encounter and updated as appropriate:  Tobacco  Allergies  Meds  Problems  Med Hx  Surg Hx  Fam Hx      Objective  Well appearing patient in no apparent distress; mood and affect are within normal limits.  A full examination was performed including scalp, head, eyes, ears, nose, lips, neck, chest, axillae, abdomen, back, buttocks, bilateral upper extremities, bilateral lower extremities, hands, feet, fingers, toes, fingernails, and toenails. All findings within normal limits unless otherwise noted below.  Objective  Dorsum of Nose, Left Lower Leg - Anterior (2), Left Upper Back (2), Right Buccal Cheek (2): Erythematous patches with gritty scale.  Objective  Left Lower Leg - Anterior (3), Left Upper Back, Mid Parietal Scalp: Erythematous stuck-on brown plaque.   Objective  Left sideburn: Pearly papule with telangectasia.      Assessment & Plan  AK (actinic keratosis) (7) Left Lower Leg - Anterior (2); Left Upper Back (2); Dorsum of Nose; Right Buccal Cheek (2)  Destruction of lesion - Dorsum of Nose, Left Lower Leg - Anterior, Left Upper Back (2), Right Buccal Cheek Complexity: simple   Destruction method: cryotherapy   Informed consent: discussed and consent obtained   Timeout:  patient name, date of birth, surgical site, and procedure verified Lesion  destroyed using liquid nitrogen: Yes   Cryotherapy cycles:  3 Outcome: patient tolerated procedure well with no complications   Post-procedure details: wound care instructions given    Inflamed seborrheic keratosis (5) Left Lower Leg - Anterior (3); Left Upper Back; Mid Parietal Scalp  Destruction of lesion - Left Lower Leg - Anterior, Left Upper Back, Mid Parietal Scalp Complexity: simple   Destruction method: cryotherapy   Informed consent: discussed and consent obtained   Timeout:  patient name, date of birth, surgical site, and procedure verified Lesion destroyed using liquid nitrogen: Yes   Cryotherapy cycles:  3 Outcome: patient tolerated procedure well with no complications   Post-procedure details: wound care instructions given    Neoplasm of uncertain behavior of skin Left sideburn  Skin / nail biopsy Type of biopsy: tangential   Informed consent: discussed and consent obtained   Timeout: patient name, date of birth, surgical site, and procedure verified   Procedure prep:  Patient was prepped and draped in usual sterile fashion (Non sterile) Prep type:  Chlorhexidine Anesthesia: the lesion was anesthetized in a standard fashion   Anesthetic:  1% lidocaine w/ epinephrine 1-100,000 local infiltration Instrument used: flexible razor blade   Outcome: patient tolerated procedure well   Post-procedure details: wound care instructions given    Specimen 1 - Surgical pathology Differential Diagnosis: bcc vs scc  Check Margins: No   I, Hadyn Azer, PA-C, have reviewed all documentation's for this visit.  The documentation on 01/29/21 for the exam, diagnosis, procedures and  orders are all accurate and complete.

## 2021-01-21 NOTE — Patient Instructions (Signed)

## 2021-01-30 ENCOUNTER — Telehealth: Payer: Self-pay

## 2021-01-30 NOTE — Telephone Encounter (Signed)
-----   Message from Warren Danes, [place] sent at 01/29/2021  5:42 PM EDT ----- 15 min surgery

## 2021-01-30 NOTE — Telephone Encounter (Signed)
Path to patient aug. Surgery made

## 2021-02-07 ENCOUNTER — Encounter: Payer: Self-pay | Admitting: Physician Assistant

## 2021-05-15 ENCOUNTER — Encounter: Payer: Self-pay | Admitting: Physician Assistant

## 2021-05-15 ENCOUNTER — Ambulatory Visit (INDEPENDENT_AMBULATORY_CARE_PROVIDER_SITE_OTHER): Payer: Medicare PPO | Admitting: Physician Assistant

## 2021-05-15 ENCOUNTER — Other Ambulatory Visit: Payer: Self-pay

## 2021-05-15 DIAGNOSIS — D0439 Carcinoma in situ of skin of other parts of face: Secondary | ICD-10-CM | POA: Diagnosis not present

## 2021-05-15 DIAGNOSIS — C4492 Squamous cell carcinoma of skin, unspecified: Secondary | ICD-10-CM

## 2021-05-15 DIAGNOSIS — D0430 Carcinoma in situ of skin of unspecified part of face: Secondary | ICD-10-CM

## 2021-05-15 NOTE — Patient Instructions (Signed)

## 2021-05-21 ENCOUNTER — Encounter: Payer: Self-pay | Admitting: Physician Assistant

## 2021-05-21 NOTE — Progress Notes (Signed)
   Follow-Up Visit   Subjective  Kristina Davis is a 80 y.o. female who presents for the following: Procedure (Patient here today for CIS x 1 left sideburn).   The following portions of the chart were reviewed this encounter and updated as appropriate:  Tobacco  Allergies  Meds  Problems  Med Hx  Surg Hx  Fam Hx      Objective  Well appearing patient in no apparent distress; mood and affect are within normal limits.  A focused examination was performed including face. Relevant physical exam findings are noted in the Assessment and Plan.  Left Sideburn Pink macule   Assessment & Plan  Carcinoma in situ of skin of face, unspecified location Left Sideburn  Destruction of lesion Complexity: simple   Destruction method: electrodesiccation and curettage   Informed consent: discussed and consent obtained   Timeout:  patient name, date of birth, surgical site, and procedure verified Anesthesia: the lesion was anesthetized in a standard fashion   Anesthetic:  1% lidocaine w/ epinephrine 1-100,000 local infiltration Curettage performed in three different directions: Yes   Electrodesiccation performed over the curetted area: Yes   Curettage cycles:  3 Final wound size (cm):  1.4 Hemostasis achieved with:  ferric subsulfate Outcome: patient tolerated procedure well with no complications   Additional details:  Wound innoculated with 5 fluorouracil solution.    I, Florita Nitsch, PA-C, have reviewed all documentation's for this visit.  The documentation on 05/21/21 for the exam, diagnosis, procedures and orders are all accurate and complete.

## 2021-08-21 ENCOUNTER — Ambulatory Visit: Payer: Medicare PPO | Admitting: Physician Assistant

## 2021-11-19 ENCOUNTER — Ambulatory Visit: Payer: Medicare PPO | Admitting: Physician Assistant

## 2021-11-19 ENCOUNTER — Other Ambulatory Visit: Payer: Self-pay

## 2021-11-19 ENCOUNTER — Encounter: Payer: Self-pay | Admitting: Physician Assistant

## 2021-11-19 DIAGNOSIS — L820 Inflamed seborrheic keratosis: Secondary | ICD-10-CM | POA: Diagnosis not present

## 2021-11-19 DIAGNOSIS — Z85828 Personal history of other malignant neoplasm of skin: Secondary | ICD-10-CM

## 2021-11-20 ENCOUNTER — Encounter: Payer: Self-pay | Admitting: Physician Assistant

## 2021-11-20 NOTE — Progress Notes (Signed)
° °  Follow-Up Visit   Subjective  Kristina Davis is a 81 y.o. female who presents for the following: Follow-up (Left sideburn cis treated and healed over good and smooth no new concerns today. H/o bcc and scc ).   The following portions of the chart were reviewed this encounter and updated as appropriate:  Tobacco   Allergies   Meds   Problems   Med Hx   Surg Hx   Fam Hx       Objective  Well appearing patient in no apparent distress; mood and affect are within normal limits.  A focused examination was performed including face, neck, chest and back. Relevant physical exam findings are noted in the Assessment and Plan.  Left Anterior Neck (3), Right Anterior Neck (3), Right Temporal Scalp (2) Brown crust with erythematous base.    Assessment & Plan  Inflamed seborrheic keratosis (8) Left Anterior Neck (3); Right Anterior Neck (3); Right Temporal Scalp (2)  Destruction of lesion - Left Anterior Neck, Right Anterior Neck, Right Temporal Scalp Complexity: simple   Destruction method: cryotherapy   Informed consent: discussed and consent obtained   Timeout:  patient name, date of birth, surgical site, and procedure verified Lesion destroyed using liquid nitrogen: Yes   Cryotherapy cycles:  1 Outcome: patient tolerated procedure well with no complications   Post-procedure details: wound care instructions given      I, Teryl Gubler, PA-C, have reviewed all documentation's for this visit.  The documentation on 11/20/21 for the exam, diagnosis, procedures and orders are all accurate and complete.

## 2022-05-19 ENCOUNTER — Ambulatory Visit: Payer: Medicare PPO | Admitting: Physician Assistant

## 2022-05-27 ENCOUNTER — Other Ambulatory Visit: Payer: Self-pay | Admitting: Family Medicine

## 2022-05-27 DIAGNOSIS — M8580 Other specified disorders of bone density and structure, unspecified site: Secondary | ICD-10-CM

## 2022-11-11 ENCOUNTER — Ambulatory Visit
Admission: RE | Admit: 2022-11-11 | Discharge: 2022-11-11 | Disposition: A | Discharge status: Home / Self Care | Payer: Medicare PPO | Source: Ambulatory Visit | Attending: Family Medicine | Admitting: Family Medicine

## 2022-11-11 DIAGNOSIS — M8580 Other specified disorders of bone density and structure, unspecified site: Secondary | ICD-10-CM

## 2023-04-26 ENCOUNTER — Encounter: Payer: Self-pay | Admitting: Gastroenterology

## 2023-07-21 ENCOUNTER — Encounter: Payer: Self-pay | Admitting: Gastroenterology

## 2023-08-11 ENCOUNTER — Ambulatory Visit: Payer: Medicare PPO | Admitting: Gastroenterology

## 2023-08-11 ENCOUNTER — Encounter: Payer: Self-pay | Admitting: Gastroenterology

## 2023-08-11 VITALS — BP 110/72 | HR 75 | Ht 62.0 in | Wt 155.0 lb

## 2023-08-11 DIAGNOSIS — Z860101 Personal history of adenomatous and serrated colon polyps: Secondary | ICD-10-CM

## 2023-08-11 MED ORDER — NA SULFATE-K SULFATE-MG SULF 17.5-3.13-1.6 GM/177ML PO SOLN: 1.0000 | Freq: Once | ORAL | 0 refills | Status: AC

## 2023-08-11 NOTE — Patient Instructions (Signed)
You have been scheduled for a colonoscopy. Please follow written instructions given to you at your visit today.   Please pick up your prep supplies at the pharmacy within the next 1-3 days.  If you use inhalers (even only as needed), please bring them with you on the day of your procedure.  DO NOT TAKE 7 DAYS PRIOR TO TEST- Trulicity (dulaglutide) Ozempic, Wegovy (semaglutide) Mounjaro (tirzepatide) Bydureon Bcise (exanatide extended release)  DO NOT TAKE 1 DAY PRIOR TO YOUR TEST Rybelsus (semaglutide) Adlyxin (lixisenatide) Victoza (liraglutide) Byetta (exanatide) ___________________________________________________________________________  The [facility] providers would like to encourage you to use [facility] to communicate with providers for non-urgent requests or questions.  Due to long hold times on the telephone, sending your provider a message by [facility] may be a faster and more efficient way to get a response.  Please allow 48 business hours for a response.  Please remember that this is for non-urgent requests.   Due to recent changes in healthcare laws, you may see the results of your imaging and laboratory studies on MyChart before your provider has had a chance to review them.  We understand that in some cases there may be results that are confusing or concerning to you. Not all laboratory results come back in the same time frame and the provider may be waiting for multiple results in order to interpret others.  Please give [place] 48 hours in order for your provider to thoroughly review all the results before contacting the office for clarification of your results.   Thank you for choosing me and [facility].  Venita Lick. Pleas Koch., MD., Clementeen Graham

## 2023-08-11 NOTE — Progress Notes (Signed)
Assessment    Personal history of multiple adenomatous colon polyps on last colonoscopy   Recommendations   Schedule colonoscopy. The risks (including bleeding, perforation, infection, missed lesions, medication reactions and possible hospitalization or surgery if complications occur), benefits, and alternatives to colonoscopy with possible biopsy and possible polypectomy were discussed with the patient and they consent to proceed.     HPI   Chief complaint: Personal history of multiple adenomatous colon polyps  Patient profile:  Kristina Davis is a 82 y.o. female referred by Richmond Campbell., PA-C with a history of multiple adenomatous colon polyps on colonoscopy in October 2021.  She has no ongoing gastrointestinal complaints.  She is in very good health and is interested in proceeding with colonoscopy. Denies weight loss, abdominal pain, constipation, diarrhea, change in stool caliber, melena, hematochezia, nausea, vomiting, dysphagia, reflux symptoms, chest pain.     Previous Labs / Imaging::    Latest Ref Rng & Units 09/10/2015    9:46 AM  CBC  WBC 4.0 - 10.5 K/uL 8.0   Hemoglobin 12.0 - 15.0 g/dL 16.1   Hematocrit 09.6 - 46.0 % 45.9   Platelets 150 - 400 K/uL 236     No results found for: "LIPASE"    Latest Ref Rng & Units 09/10/2015    9:46 AM  CMP  Glucose 65 - 99 mg/dL 95   BUN 6 - 20 mg/dL 15   Creatinine 0.45 - 1.00 mg/dL 4.09   Sodium 811 - 914 mmol/L 140   Potassium 3.5 - 5.1 mmol/L 4.1   Chloride 101 - 111 mmol/L 104   CO2 22 - 32 mmol/L 29   Calcium 8.9 - 10.3 mg/dL 78.2      Previous GI evaluation    Endoscopies:  Colonoscopy Oct 2021 - One 4 mm polyp in the cecum, removed with a cold biopsy forceps. Resected and retrieved.  - Five 5 to 8 mm polyps in the sigmoid colon and in the transverse colon, removed with a cold snare. Resected and retrieved.  - One 20 mm polyp in the descending colon, removed with a hot snare. Resected and retrieved.  Tattooed.  - Moderate diverticulosis in the left colon.  - Internal hemorrhoids.  - The examination was otherwise normal on direct and retroflexion views. 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   Left anterior crown   Hypertension    Squamous cell carcinoma of skin 01/21/2021   in situ-left sideburn (CX35FU)   Past Surgical History:  Procedure Laterality Date    bone spur on right heal Right    BACK SURGERY     lumbar fusion   cateract Bilateral    lens implants   HAND SURGERY     TUBAL LIGATION     Family History  Problem Relation Age of Onset   Colon polyps Mother    Colon cancer Neg Hx    Esophageal cancer Neg Hx    Rectal cancer Neg Hx    Stomach cancer Neg Hx    Social History   Tobacco Use   Smoking status: Never   Smokeless tobacco: Never  Vaping Use   Vaping status: Never Used  Substance Use Topics   Alcohol use: Yes    Comment: socially   Drug use: No   Current Outpatient Medications  Medication Sig Dispense Refill   Calcium Carb-Cholecalciferol (CALCIUM +  D3) 600-200 MG-UNIT TABS Take 1 tablet by mouth daily.     cetirizine (ZYRTEC) 10 MG tablet Take 10 mg by mouth daily.     hydrochlorothiazide (HYDRODIURIL) 25 MG tablet Take 25 mg by mouth daily.     Multiple Vitamins-Minerals (WOMENS 50+ MULTI VITAMIN/MIN PO) Take 1 tablet by mouth daily.     No current facility-administered medications for this visit.   Allergies  Allergen Reactions   Dexamethasone Itching   Doxycycline Itching   Penicillins Itching and Other (See Comments)    Foot itch    Review of Systems: All other systems reviewed and negative except where noted in HPI.    Physical Exam    Wt Readings from Last 3 Encounters:  07/31/20 155 lb (70.3 kg)  07/17/20 155 lb (70.3 kg)  03/05/20 150 lb (68 kg)    There were no vitals taken for this visit. Constitutional:  Generally well  appearing female in no acute distress. HEENT: Pupils normal.  Conjunctivae are normal. No scleral icterus. No oral lesions or deformities noted.  Neck: Supple.  Cardiac: Normal rate, regular rhythm without murmurs. Pulmonary/chest: Effort normal and breath sounds normal. No wheezing, rales or rhonchi. Abdominal: Soft, nondistended, nontender. Active bowel sounds. No palpable HSM, masses or hernias. Rectal: Deferred to colonoscopy Extremities: No edema or deformities noted Neurological: Alert and oriented to person, place and time. Psychiatric: Pleasant. Normal mood and affect. Behavior is normal. Skin: Skin is warm and dry. No rashes noted.  Claudette Head, MD   cc:  Referring Provider Richmond Campbell., PA-C

## 2023-09-01 ENCOUNTER — Encounter: Payer: Self-pay | Admitting: Gastroenterology

## 2023-09-20 ENCOUNTER — Encounter: Payer: Self-pay | Admitting: Gastroenterology

## 2023-09-20 ENCOUNTER — Ambulatory Visit (AMBULATORY_SURGERY_CENTER): Payer: Medicare PPO | Admitting: Gastroenterology

## 2023-09-20 VITALS — BP 138/68 | HR 94 | Temp 97.5°F | Resp 10 | Ht 62.0 in | Wt 155.0 lb

## 2023-09-20 DIAGNOSIS — Z1211 Encounter for screening for malignant neoplasm of colon: Secondary | ICD-10-CM | POA: Diagnosis not present

## 2023-09-20 DIAGNOSIS — D124 Benign neoplasm of descending colon: Secondary | ICD-10-CM

## 2023-09-20 DIAGNOSIS — Z860101 Personal history of adenomatous and serrated colon polyps: Secondary | ICD-10-CM | POA: Diagnosis not present

## 2023-09-20 MED ORDER — SODIUM CHLORIDE 0.9 % IV SOLN: 500.0000 mL | Freq: Once | INTRAVENOUS | Status: DC

## 2023-09-20 NOTE — Progress Notes (Signed)
History & Physical  Primary Care Physician:  Richmond Campbell., PA-C Primary Gastroenterologist: Claudette Head, MD  Impression / Plan:  Similar history of multiple adenomatous colon polyps for surveillance colonoscopy.  CHIEF COMPLAINT:  Personal history of colon polyps   HPI: Kristina Davis is a 82 y.o. female with a personal history of multiple adenomatous colon polyps for surveillance colonoscopy.    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 Authorizing Provider  Calcium Carb-Cholecalciferol (CALCIUM + D3) 600-200 MG-UNIT TABS Take 1 tablet by mouth daily.   Yes [provider]  cetirizine (ZYRTEC) 10 MG tablet Take 10 mg by mouth daily.   Yes [provider]  hydrochlorothiazide (HYDRODIURIL) 25 MG tablet Take 25 mg by mouth daily.   Yes [provider]  Multiple Vitamins-Minerals (WOMENS 50+ MULTI VITAMIN/MIN PO) Take 1 tablet by mouth daily.   Yes [provider]    Current Outpatient Medications  Medication Sig Dispense Refill   Calcium Carb-Cholecalciferol (CALCIUM + D3) 600-200 MG-UNIT TABS Take 1 tablet by mouth daily.     cetirizine (ZYRTEC) 10 MG tablet Take 10 mg by mouth daily.     hydrochlorothiazide (HYDRODIURIL) 25 MG tablet Take 25 mg by mouth daily.     Multiple Vitamins-Minerals (WOMENS 50+ MULTI VITAMIN/MIN PO) Take 1 tablet by mouth daily.     Current Facility-Administered Medications  Medication Dose Route Frequency Provider Last Rate Last Admin   0.9 %  sodium  chloride infusion  500 mL Intravenous Once Meryl Dare, MD        Allergies as of 09/20/2023 - Review Complete 09/20/2023  Allergen Reaction Noted   Dexamethasone Itching 09/06/2015   Doxycycline Itching 09/06/2015   Penicillins Itching and Other (See Comments) 09/06/2015    Family History  Problem Relation Age of Onset   Colon polyps Mother    Colon cancer Neg Hx    Esophageal cancer Neg Hx    Rectal cancer Neg Hx    Stomach cancer Neg Hx     Social History   Socioeconomic History   Marital status: Married    Spouse name: Not on file   Number of children: 2   Years of education: Not on file   Highest education level: Not on file  Occupational History   Occupation: retired  Tobacco Use   Smoking status: Never   Smokeless tobacco: Never  Vaping Use   Vaping status: Never Used  Substance and Sexual Activity   Alcohol use: Yes    Comment: socially   Drug use: No   Sexual activity: Not on file  Other Topics Concern   Not on file  Social History Narrative   Not on file   Social Determinants of Health   Financial Resource Strain: Low Risk  (05/20/2022)   [facility]12[facility] visits prior to 12/12/2022.   Overall Financial Resource Strain (CARDIA)    Difficulty of Paying Living Expenses: Not hard at all  Food Insecurity: Low Risk  (05/25/2023)   [facility]   Hunger Vital Sign    Worried About Running Out of Food in the Last Year: Never true    Ran Out of Food in the Last Year: Never true  Transportation Needs: Not on file (05/25/2023)  Physical Activity: Insufficiently Active (05/20/2022)   [facility] prior to 12/12/2022[facility] visits prior to 12/12/2022.   Exercise Vital Sign    Days of Exercise per Week: 1 day    Minutes of Exercise  per Session: 20 min  Stress: No Stress Concern Present (05/20/2022)   [facility] to 12/12/2022[facility] visits prior to 12/12/2022.   [employer] of Occupational Health - Occupational Stress Questionnaire    Feeling of Stress : Not at all  Social Connections: Socially Integrated (05/20/2022)   [facility] prior to 12/12/2022[facility] visits prior to 12/12/2022.   Social Advertising account executive [NHANES]    Frequency of Communication with Friends and Family: More than three times a week    Frequency of Social Gatherings with Friends and Family: Twice a week    Attends Religious Services: 1 to 4 times per year    Active Member of [employer] or Organizations: Yes    Attends Banker Meetings: More than 4 times per year    Marital Status: Married  Catering manager Violence: Not At Risk (05/20/2022)   [facility] prior to 12/12/2022., [facility] visits prior to 12/12/2022.   Humiliation, Afraid, Rape, and Kick questionnaire    Fear of Current or Ex-Partner: No    Emotionally Abused: No    Physically Abused: No    Sexually Abused: No    Review of Systems:  All systems reviewed were negative except where noted in HPI.   Physical Exam:  General:  Alert, well-developed, in NAD Head:  Normocephalic and atraumatic. Eyes:  Sclera clear, no icterus.   Conjunctiva pink. Ears:  Normal auditory acuity. Mouth:  No deformity or lesions.  Neck:  Supple; no masses. Lungs:  Clear throughout to auscultation.   No wheezes, crackles, or rhonchi.  Heart:  Regular rate and rhythm; no murmurs. Abdomen:  Soft, nondistended, nontender. No masses, hepatomegaly. No palpable masses.  Normal bowel sounds.    Rectal:  Deferred   Msk:   Symmetrical without gross deformities. Extremities:  Without edema. Neurologic:  Alert and  oriented x 4; grossly normal neurologically. Skin:  Intact without significant lesions or rashes. Psych:  Alert and cooperative. Normal mood and affect.   Venita Lick. Russella Dar  09/20/2023, 1:48 PM See Loretha Stapler, [facility], to contact our on call provider

## 2023-09-20 NOTE — Progress Notes (Signed)
Called to room to assist during endoscopic procedure.  Patient ID and intended procedure confirmed with present staff. Received instructions for my participation in the procedure from the performing physician.  

## 2023-09-20 NOTE — Progress Notes (Signed)
Report to PACU, RN, vss, BBS= Clear.  

## 2023-09-20 NOTE — Patient Instructions (Addendum)
Resume all of your previous medications today as ordered.  Read your discharge instructions.  YOU HAD AN ENDOSCOPIC PROCEDURE TODAY AT THE [facility]:   Refer to the procedure report that was given to you for any specific questions about what was found during the examination.  If the procedure report does not answer your questions, please call your gastroenterologist to clarify.  If you requested that your care partner not be given the details of your procedure findings, then the procedure report has been included in a sealed envelope for you to review at your convenience later.  YOU SHOULD EXPECT: Some feelings of bloating in the abdomen. Passage of more gas than usual.  Walking can help get rid of the air that was put into your GI tract during the procedure and reduce the bloating. If you had a lower endoscopy (such as a colonoscopy or flexible sigmoidoscopy) you may notice spotting of blood in your stool or on the toilet paper. If you underwent a bowel prep for your procedure, you may not have a normal bowel movement for a few days.  Please Note:  You might notice some irritation and congestion in your nose or some drainage.  This is from the oxygen used during your procedure.  There is no need for concern and it should clear up in a day or so.  SYMPTOMS TO REPORT IMMEDIATELY:  Following lower endoscopy (colonoscopy or flexible sigmoidoscopy):  Excessive amounts of blood in the stool  Significant tenderness or worsening of abdominal pains  Swelling of the abdomen that is new, acute  Fever of 100F or higher    For urgent or emergent issues, a gastroenterologist can be reached at any hour by calling [phone]. Do not use MyChart messaging for urgent concerns.    DIET:  We do recommend a small meal at first, but then you may proceed to your regular diet.  Drink plenty of fluids but you should avoid alcoholic beverages for 24 hours. Be sure to eat a high fiber diet, and  drink plenty of water.  ACTIVITY:  You should plan to take it easy for the rest of today and you should NOT DRIVE or use heavy machinery until tomorrow (because of the sedation medicines used during the test).    FOLLOW UP: Our staff will call the number listed on your records the next business day following your procedure.  We will call around 7:15- 8:00 am to check on you and address any questions or concerns that you may have regarding the information given to you following your procedure. If we do not reach you, we will leave a message.     If any biopsies were taken you will be contacted by phone or by letter within the next 1-3 weeks.  Please call us at [phone] if you have not heard about the biopsies in 3 weeks.    SIGNATURES/CONFIDENTIALITY: You and/or your care partner have signed paperwork which will be entered into your electronic medical record.  These signatures attest to the fact that that the information above on your After Visit Summary has been reviewed and is understood.  Full responsibility of the confidentiality of this discharge information lies with you and/or your care-partner.

## 2023-09-21 ENCOUNTER — Telehealth: Payer: Self-pay

## 2023-09-21 NOTE — Telephone Encounter (Signed)
  Follow up Call-     09/20/2023    1:09 PM  Call back number  Post procedure Call Back phone  # [phone]  Permission to leave phone message Yes     Patient questions:  Do you have a fever, pain , or abdominal swelling? No. Pain Score  0 *  Have you tolerated food without any problems? Yes.    Have you been able to return to your normal activities? Yes.    Do you have any questions about your discharge instructions: Diet   No. Medications  No. Follow up visit  No.  Do you have questions or concerns about your Care? No.  Actions: * If pain score is 4 or above: No action needed, pain <4.

## 2023-09-23 LAB — SURGICAL PATHOLOGY

## 2023-09-24 NOTE — Op Note (Signed)
[facility] Patient Name: Kristina Davis Procedure Date: 09/20/2023 1:47 PM MRN: [id number] Endoscopist: Meryl Dare , MD, [phone] Age: 82 Referring MD:  Date of Birth: 09-01-1941 Gender: Female Account #: [id number] Procedure:                Colonoscopy Indications:              Surveillance: Personal history of adenomatous                            polyps on last colonoscopy 3 years ago Medicines:                Monitored Anesthesia Care Procedure:                Pre-Anesthesia Assessment:                           - Prior to the procedure, a History and Physical                            was performed, and patient medications and                            allergies were reviewed. The patient's tolerance of                            previous anesthesia was also reviewed. The risks                            and benefits of the procedure and the sedation                            options and risks were discussed with the patient.                            All questions were answered, and informed consent                            was obtained. Prior Anticoagulants: The patient has                            taken no anticoagulant or antiplatelet agents. ASA                            Grade Assessment: II - A patient with mild systemic                            disease. After reviewing the risks and benefits,                            the patient was deemed in satisfactory condition to                            undergo the procedure.  After obtaining informed consent, the colonoscope                            was passed under direct vision. Throughout the                            procedure, the patient's blood pressure, pulse, and                            oxygen saturations were monitored continuously. The                            Olympus Scope [id number] was introduced through the                            anus and advanced to the  the cecum, identified by                            appendiceal orifice and ileocecal valve. The                            ileocecal valve, appendiceal orifice, and rectum                            were photographed. The quality of the bowel                            preparation was good. The colonoscopy was performed                            without difficulty. The patient tolerated the                            procedure well. Scope In: 1:58:04 PM Scope Out: 2:17:16 PM Scope Withdrawal Time: 0 hours 15 minutes 22 seconds  Total Procedure Duration: 0 hours 19 minutes 12 seconds  Findings:                 The perianal and digital rectal examinations were                            normal.                           A 7 mm polyp was found in the descending colon. The                            polyp was sessile. The polyp was removed with a                            cold snare. Resection and retrieval were complete.                           A tattoo was seen in the descending colon. The  tattoo site appeared normal.                           Multiple large-mouthed, medium-mouthed and                            small-mouthed diverticula were found in the left                            colon. There was no evidence of diverticular                            bleeding.                           The exam was otherwise without abnormality on                            direct and retroflexion views. Complications:            No immediate complications. Estimated blood loss:                            None. Estimated Blood Loss:     Estimated blood loss: none. Impression:               - One 7 mm polyp in the descending colon, removed                            with a cold snare. Resected and retrieved.                           - A tattoo was seen in the descending colon. The                            tattoo site appeared normal.                           -  Moderate diverticulosis in the left colon.                           - The examination was otherwise normal on direct                            and retroflexion views. Recommendation:           - Patient has a contact number available for                            emergencies. The signs and symptoms of potential                            delayed complications were discussed with the                            patient. Return to normal activities tomorrow.  Written discharge instructions were provided to the                            patient.                           - Resume previous diet adding high fiber.                           - Continue present medications.                           - Await pathology results.                           - No repeat colonoscopy due to age. Meryl Dare, MD 09/20/2023 2:21:57 PM This report has been signed electronically.

## 2023-10-07 ENCOUNTER — Encounter: Payer: Self-pay | Admitting: Gastroenterology

## 2024-08-29 ENCOUNTER — Encounter: Payer: Self-pay | Admitting: Physician Assistant

## 2024-08-29 ENCOUNTER — Ambulatory Visit: Admitting: Physician Assistant

## 2024-08-29 ENCOUNTER — Other Ambulatory Visit: Payer: Self-pay

## 2024-08-29 DIAGNOSIS — M79675 Pain in left toe(s): Secondary | ICD-10-CM | POA: Diagnosis not present

## 2024-08-29 DIAGNOSIS — M79672 Pain in left foot: Secondary | ICD-10-CM | POA: Diagnosis not present

## 2024-08-29 DIAGNOSIS — M25572 Pain in left ankle and joints of left foot: Secondary | ICD-10-CM

## 2024-08-29 NOTE — Progress Notes (Signed)
 Office Visit Note   Patient: Kristina Davis           Date of Birth: 1941-09-21           MRN: [id number] Visit Date: 08/29/2024              Requested by: Debrah Josette LELON., PA-C [address],  [place] [address] PCP: Debrah Josette LELON., PA-C  Chief Complaint  Patient presents with   Left Foot - Pain      HPI: 83 y/o female who was at her grand daughter play Saturday night and slipped on a piece of fabric on the floor causing her to fall flat on her back.  She developed pain in the left GT area with bruising as well as lateral ankle pain.  She had a Cam boot at home from a previous injury and has been wearing it, ice, and elevation to decrease edema.    She states the swelling has improved and she feels stiffness more than anything.    Assessment & Plan: Visit Diagnoses:  1. Great toe pain, left   2. Left foot pain   3. Pain in left ankle and joints of left foot     Plan: Ice elevation PRN, cam boot.  Perform ankle ROM out of the boot .  Afetr 2 weeks wean out of the boot as tolerates into a stable lace up shoe.  She is already trending towards getting better and the swelling has gone down.  Her pain has improved as well.  If she has pain or issues she will call for follow up visit.  She agrees wit this plan.  Follow-Up Instructions: Return if symptoms worsen or fail to improve.   Ortho Exam  Patient is alert, oriented, no adenopathy, well-dressed, normal affect, normal respiratory effort. Left foot with ecchymosis base and lateral GT.  Slight pain over the lateral malleolus.  She has full ROM of the dorsiflexion and plantarflexion without pain.  Mild pain with inversion and eversion.  No edema or cellulitis.      Imaging: Foot x ray shows no fracture of dislocation of the GT.  Joint space is maintained PIP joint.   The ankle x ray shows maintained ankle joint, no fractures, good bone alignment.    Labs: No results found for: HGBA1C, ESRSEDRATE, CRP,  LABURIC, REPTSTATUS, GRAMSTAIN, CULT, LABORGA   No results found for: ALBUMIN, PREALBUMIN, CBC  No results found for: MG No results found for: VD25OH  No results found for: PREALBUMIN    Latest Ref Rng & Units 09/10/2015    9:46 AM  CBC EXTENDED  WBC 4.0 - 10.5 K/uL 8.0   RBC 3.87 - 5.11 MIL/uL 5.08   Hemoglobin 12.0 - 15.0 g/dL 84.5   HCT 63.9 - 53.9 % 45.9   Platelets 150 - 400 K/uL 236      There is no height or weight on file to calculate BMI.  Orders:  Orders Placed This Encounter  Procedures   XR Foot 2 Views Left   XR Ankle 2 Views Left   No orders of the defined types were placed in this encounter.    Procedures: No procedures performed  Clinical Data: No additional findings.  ROS:  All other systems negative, except as noted in the HPI. Review of Systems  Objective: Vital Signs: There were no vitals taken for this visit.  Specialty Comments:  No specialty comments available.  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   Past Medical History:  Diagnosis Date   Arthritis    BCC (basal cell carcinoma) nodular 04/21/2018   Left anterior crown   Hypertension    Squamous cell carcinoma of skin 01/21/2021   in situ-left sideburn (CX35FU)    Family History  Problem Relation Age of Onset   Colon polyps Mother    Colon cancer Neg Hx    Esophageal cancer Neg Hx    Rectal cancer Neg Hx    Stomach cancer Neg Hx     Past Surgical History:  Procedure Laterality Date    bone spur on right heal Right    BACK SURGERY     lumbar fusion   cateract Bilateral    lens implants   HAND SURGERY     TUBAL LIGATION     Social History   Occupational History   Occupation: retired   Tobacco Use   Smoking status: Never   Smokeless tobacco: Never  Vaping Use   Vaping status: Never Used  Substance and Sexual Activity   Alcohol use: Yes    Comment: socially   Drug use: No   Sexual activity: Not on file
